# Patient Record
Sex: Female | Born: 1955
Health system: Southern US, Community
[De-identification: ages and names within clinical notes are randomized; demographics above are authoritative.]

## PROBLEM LIST (undated history)

## (undated) DIAGNOSIS — Z85850 Personal history of malignant neoplasm of thyroid: Secondary | ICD-10-CM

## (undated) DIAGNOSIS — Z9889 Other specified postprocedural states: Secondary | ICD-10-CM

## (undated) DIAGNOSIS — C801 Malignant (primary) neoplasm, unspecified: Secondary | ICD-10-CM

## (undated) DIAGNOSIS — J984 Other disorders of lung: Secondary | ICD-10-CM

## (undated) DIAGNOSIS — E039 Hypothyroidism, unspecified: Secondary | ICD-10-CM

## (undated) DIAGNOSIS — Z86718 Personal history of other venous thrombosis and embolism: Secondary | ICD-10-CM

## (undated) DIAGNOSIS — E892 Postprocedural hypoparathyroidism: Secondary | ICD-10-CM

## (undated) DIAGNOSIS — E785 Hyperlipidemia, unspecified: Secondary | ICD-10-CM

## (undated) DIAGNOSIS — I10 Essential (primary) hypertension: Secondary | ICD-10-CM

## (undated) DIAGNOSIS — N209 Urinary calculus, unspecified: Secondary | ICD-10-CM

## (undated) HISTORY — DX: Hyperlipidemia, unspecified: E78.5

## (undated) HISTORY — DX: Hypothyroidism, unspecified: E03.9

## (undated) HISTORY — DX: Other disorders of lung: J98.4

## (undated) HISTORY — DX: Malignant (primary) neoplasm, unspecified: C80.1

## (undated) HISTORY — DX: Urinary calculus, unspecified: N20.9

## (undated) HISTORY — DX: Postprocedural hypoparathyroidism: E89.2

## (undated) HISTORY — DX: Other specified postprocedural states: Z98.890

## (undated) HISTORY — DX: Personal history of malignant neoplasm of thyroid: Z85.850

## (undated) HISTORY — DX: Personal history of other venous thrombosis and embolism: Z86.718

## (undated) HISTORY — DX: Essential (primary) hypertension: I10

## (undated) HISTORY — PX: AUGMENTATION MAMMAPLASTY: SUR837

## (undated) HISTORY — PX: ABDOMINAL HYSTERECTOMY: SUR658

---

## 2000-04-25 HISTORY — PX: MASTECTOMY: SHX3

## 2001-05-08 ENCOUNTER — Ambulatory Visit (HOSPITAL_COMMUNITY): Admission: RE | Admit: 2001-05-08 | Discharge: 2001-05-08 | Payer: Self-pay | Admitting: General Surgery

## 2001-06-14 ENCOUNTER — Encounter: Payer: Self-pay | Admitting: General Surgery

## 2001-06-14 ENCOUNTER — Ambulatory Visit (HOSPITAL_COMMUNITY): Admission: RE | Admit: 2001-06-14 | Discharge: 2001-06-14 | Payer: Self-pay | Admitting: General Surgery

## 2001-07-11 ENCOUNTER — Encounter: Payer: Self-pay | Admitting: General Surgery

## 2001-07-11 ENCOUNTER — Ambulatory Visit (HOSPITAL_COMMUNITY): Admission: RE | Admit: 2001-07-11 | Discharge: 2001-07-11 | Payer: Self-pay | Admitting: General Surgery

## 2002-01-17 ENCOUNTER — Ambulatory Visit: Admission: RE | Admit: 2002-01-17 | Discharge: 2002-04-17 | Payer: Self-pay | Admitting: Radiation Oncology

## 2002-07-08 ENCOUNTER — Ambulatory Visit (HOSPITAL_COMMUNITY): Admission: RE | Admit: 2002-07-08 | Discharge: 2002-07-08 | Payer: Self-pay | Admitting: General Surgery

## 2003-09-24 ENCOUNTER — Encounter: Admission: RE | Admit: 2003-09-24 | Discharge: 2003-09-24 | Payer: Self-pay | Admitting: Neurology

## 2004-06-22 ENCOUNTER — Ambulatory Visit: Payer: Self-pay | Admitting: Cardiology

## 2004-07-26 ENCOUNTER — Ambulatory Visit: Payer: Self-pay | Admitting: Internal Medicine

## 2005-04-25 HISTORY — PX: THYROIDECTOMY: SHX17

## 2005-09-22 ENCOUNTER — Ambulatory Visit (HOSPITAL_COMMUNITY): Admission: RE | Admit: 2005-09-22 | Discharge: 2005-09-22 | Payer: Self-pay | Admitting: Hematology and Oncology

## 2005-10-19 ENCOUNTER — Ambulatory Visit (HOSPITAL_COMMUNITY): Admission: RE | Admit: 2005-10-19 | Discharge: 2005-10-19 | Payer: Self-pay | Admitting: Hematology and Oncology

## 2005-10-19 ENCOUNTER — Encounter (INDEPENDENT_AMBULATORY_CARE_PROVIDER_SITE_OTHER): Payer: Self-pay | Admitting: *Deleted

## 2005-11-23 DIAGNOSIS — Z9089 Acquired absence of other organs: Secondary | ICD-10-CM

## 2005-11-23 DIAGNOSIS — E890 Postprocedural hypothyroidism: Secondary | ICD-10-CM

## 2005-11-23 HISTORY — DX: Acquired absence of other organs: Z90.89

## 2005-11-23 HISTORY — DX: Postprocedural hypothyroidism: E89.0

## 2005-12-23 ENCOUNTER — Encounter (INDEPENDENT_AMBULATORY_CARE_PROVIDER_SITE_OTHER): Payer: Self-pay | Admitting: *Deleted

## 2005-12-23 ENCOUNTER — Ambulatory Visit (HOSPITAL_COMMUNITY): Admission: RE | Admit: 2005-12-23 | Discharge: 2005-12-24 | Payer: Self-pay | Admitting: Surgery

## 2006-01-27 ENCOUNTER — Ambulatory Visit: Payer: Self-pay | Admitting: Endocrinology

## 2006-02-06 ENCOUNTER — Encounter: Admission: RE | Admit: 2006-02-06 | Discharge: 2006-02-06 | Payer: Self-pay | Admitting: Endocrinology

## 2006-03-07 ENCOUNTER — Encounter: Admission: RE | Admit: 2006-03-07 | Discharge: 2006-03-07 | Payer: Self-pay | Admitting: Endocrinology

## 2006-03-23 ENCOUNTER — Ambulatory Visit: Payer: Self-pay | Admitting: Endocrinology

## 2006-09-05 ENCOUNTER — Ambulatory Visit: Payer: Self-pay | Admitting: Endocrinology

## 2006-09-05 LAB — CONVERTED CEMR LAB
Calcium, Total (PTH): 9.2 mg/dL (ref 8.4–10.5)
TSH: 0.65 microintl units/mL (ref 0.35–5.50)

## 2006-10-06 ENCOUNTER — Ambulatory Visit: Payer: Self-pay | Admitting: Endocrinology

## 2006-10-31 ENCOUNTER — Encounter: Admission: RE | Admit: 2006-10-31 | Discharge: 2006-10-31 | Payer: Self-pay | Admitting: Endocrinology

## 2006-11-27 ENCOUNTER — Ambulatory Visit: Payer: Self-pay | Admitting: Endocrinology

## 2006-11-27 LAB — CONVERTED CEMR LAB
TSH: 147.863 microintl units/mL — ABNORMAL HIGH (ref 0.350–5.50)
Thyroglobulin Ab: <30 (ref 0.0–60.0)

## 2007-01-04 ENCOUNTER — Encounter: Payer: Self-pay | Admitting: Endocrinology

## 2007-01-04 DIAGNOSIS — I10 Essential (primary) hypertension: Secondary | ICD-10-CM

## 2007-01-04 DIAGNOSIS — Z86718 Personal history of other venous thrombosis and embolism: Secondary | ICD-10-CM

## 2007-01-04 DIAGNOSIS — R32 Unspecified urinary incontinence: Secondary | ICD-10-CM

## 2007-01-04 DIAGNOSIS — Z853 Personal history of malignant neoplasm of breast: Secondary | ICD-10-CM

## 2007-01-05 ENCOUNTER — Ambulatory Visit: Payer: Self-pay | Admitting: Endocrinology

## 2007-01-05 DIAGNOSIS — E209 Hypoparathyroidism, unspecified: Secondary | ICD-10-CM

## 2007-01-05 DIAGNOSIS — C73 Malignant neoplasm of thyroid gland: Secondary | ICD-10-CM

## 2007-01-05 LAB — CONVERTED CEMR LAB: TSH: 0.14 microintl units/mL — ABNORMAL LOW (ref 0.35–5.50)

## 2007-01-07 ENCOUNTER — Encounter: Payer: Self-pay | Admitting: Endocrinology

## 2007-03-20 ENCOUNTER — Ambulatory Visit: Payer: Self-pay | Admitting: Endocrinology

## 2007-03-20 DIAGNOSIS — R209 Unspecified disturbances of skin sensation: Secondary | ICD-10-CM

## 2007-03-26 LAB — CONVERTED CEMR LAB
CO2: 36 meq/L — ABNORMAL HIGH (ref 19–32)
GFR calc Af Amer: 75 mL/min
Glucose, Bld: 90 mg/dL (ref 70–99)
Potassium: 3.1 meq/L — ABNORMAL LOW (ref 3.5–5.1)

## 2007-06-28 ENCOUNTER — Encounter: Payer: Self-pay | Admitting: Endocrinology

## 2007-07-05 ENCOUNTER — Encounter: Payer: Self-pay | Admitting: Endocrinology

## 2007-07-09 ENCOUNTER — Ambulatory Visit (HOSPITAL_BASED_OUTPATIENT_CLINIC_OR_DEPARTMENT_OTHER): Admission: RE | Admit: 2007-07-09 | Discharge: 2007-07-09 | Payer: Self-pay | Admitting: Specialist

## 2007-08-01 ENCOUNTER — Ambulatory Visit: Payer: Self-pay | Admitting: Endocrinology

## 2007-08-01 DIAGNOSIS — E890 Postprocedural hypothyroidism: Secondary | ICD-10-CM

## 2007-08-02 ENCOUNTER — Encounter: Payer: Self-pay | Admitting: Endocrinology

## 2007-08-02 LAB — CONVERTED CEMR LAB: TSH: 0.06 microintl units/mL — ABNORMAL LOW (ref 0.35–5.50)

## 2007-08-05 LAB — CONVERTED CEMR LAB
Calcium, Total (PTH): 9.8 mg/dL (ref 8.4–10.5)
PTH: 5.8 pg/mL — ABNORMAL LOW (ref 14.0–72.0)

## 2007-08-28 ENCOUNTER — Encounter (INDEPENDENT_AMBULATORY_CARE_PROVIDER_SITE_OTHER): Payer: Self-pay | Admitting: *Deleted

## 2007-09-04 ENCOUNTER — Encounter: Payer: Self-pay | Admitting: Endocrinology

## 2007-11-05 ENCOUNTER — Ambulatory Visit (HOSPITAL_BASED_OUTPATIENT_CLINIC_OR_DEPARTMENT_OTHER): Admission: RE | Admit: 2007-11-05 | Discharge: 2007-11-05 | Payer: Self-pay | Admitting: Specialist

## 2008-01-04 ENCOUNTER — Encounter: Payer: Self-pay | Admitting: Endocrinology

## 2008-01-10 ENCOUNTER — Encounter: Payer: Self-pay | Admitting: Endocrinology

## 2008-02-06 ENCOUNTER — Ambulatory Visit: Payer: Self-pay | Admitting: Endocrinology

## 2008-02-13 ENCOUNTER — Ambulatory Visit (HOSPITAL_COMMUNITY): Admission: RE | Admit: 2008-02-13 | Discharge: 2008-02-13 | Payer: Self-pay | Admitting: Urology

## 2008-07-17 ENCOUNTER — Encounter: Payer: Self-pay | Admitting: Endocrinology

## 2008-08-04 ENCOUNTER — Ambulatory Visit: Payer: Self-pay | Admitting: Endocrinology

## 2008-08-04 LAB — CONVERTED CEMR LAB
Calcium, Total (PTH): 9.5 mg/dL (ref 8.4–10.5)
PTH: 30.5 pg/mL (ref 14.0–72.0)

## 2008-09-05 ENCOUNTER — Ambulatory Visit (HOSPITAL_COMMUNITY): Admission: RE | Admit: 2008-09-05 | Discharge: 2008-09-05 | Payer: Self-pay | Admitting: Urology

## 2008-10-28 ENCOUNTER — Ambulatory Visit (HOSPITAL_COMMUNITY): Admission: RE | Admit: 2008-10-28 | Discharge: 2008-10-28 | Payer: Self-pay | Admitting: Urology

## 2008-10-29 ENCOUNTER — Encounter: Admission: RE | Admit: 2008-10-29 | Discharge: 2008-10-29 | Payer: Self-pay | Admitting: Internal Medicine

## 2008-11-10 ENCOUNTER — Encounter (INDEPENDENT_AMBULATORY_CARE_PROVIDER_SITE_OTHER): Payer: Self-pay | Admitting: Specialist

## 2008-11-10 ENCOUNTER — Ambulatory Visit (HOSPITAL_BASED_OUTPATIENT_CLINIC_OR_DEPARTMENT_OTHER): Admission: RE | Admit: 2008-11-10 | Discharge: 2008-11-10 | Payer: Self-pay | Admitting: Specialist

## 2009-01-20 ENCOUNTER — Encounter: Payer: Self-pay | Admitting: Endocrinology

## 2009-07-14 ENCOUNTER — Encounter: Payer: Self-pay | Admitting: Endocrinology

## 2009-07-28 ENCOUNTER — Ambulatory Visit (HOSPITAL_COMMUNITY): Admission: RE | Admit: 2009-07-28 | Discharge: 2009-07-28 | Payer: Self-pay | Admitting: Urology

## 2009-08-06 ENCOUNTER — Ambulatory Visit: Payer: Self-pay | Admitting: Endocrinology

## 2009-08-06 ENCOUNTER — Telehealth: Payer: Self-pay | Admitting: Endocrinology

## 2009-08-06 LAB — CONVERTED CEMR LAB: Calcium, Total (PTH): 9.6 mg/dL (ref 8.4–10.5)

## 2009-10-30 ENCOUNTER — Encounter: Admission: RE | Admit: 2009-10-30 | Discharge: 2009-10-30 | Payer: Self-pay | Admitting: Hematology and Oncology

## 2009-11-24 ENCOUNTER — Inpatient Hospital Stay (HOSPITAL_COMMUNITY): Admission: AD | Admit: 2009-11-24 | Discharge: 2009-11-28 | Payer: Self-pay | Admitting: Interventional Cardiology

## 2009-11-24 ENCOUNTER — Ambulatory Visit: Payer: Self-pay | Admitting: Pulmonary Disease

## 2009-11-25 ENCOUNTER — Encounter (INDEPENDENT_AMBULATORY_CARE_PROVIDER_SITE_OTHER): Payer: Self-pay | Admitting: Interventional Cardiology

## 2009-12-04 ENCOUNTER — Ambulatory Visit: Payer: Self-pay | Admitting: Endocrinology

## 2009-12-04 LAB — CONVERTED CEMR LAB
CO2: 26 meq/L (ref 19–32)
Chloride: 112 meq/L (ref 96–112)
Creatinine, Ser: 1.8 mg/dL — ABNORMAL HIGH (ref 0.4–1.2)
Potassium: 4 meq/L (ref 3.5–5.1)

## 2010-01-12 ENCOUNTER — Ambulatory Visit: Payer: Self-pay | Admitting: Endocrinology

## 2010-01-12 LAB — CONVERTED CEMR LAB
BUN: 13 mg/dL (ref 6–23)
CO2: 32 meq/L (ref 19–32)
Calcium, Total (PTH): 9.3 mg/dL (ref 8.4–10.5)
GFR calc non Af Amer: 52.18 mL/min (ref >60)
Glucose, Bld: 82 mg/dL (ref 70–99)
PTH: 4.1 pg/mL — ABNORMAL LOW (ref 14.0–72.0)
Potassium: 4.4 meq/L (ref 3.5–5.1)

## 2010-01-20 ENCOUNTER — Telehealth: Payer: Self-pay | Admitting: Endocrinology

## 2010-01-25 ENCOUNTER — Ambulatory Visit (HOSPITAL_COMMUNITY): Admission: RE | Admit: 2010-01-25 | Discharge: 2010-01-25 | Payer: Self-pay | Admitting: Internal Medicine

## 2010-02-04 ENCOUNTER — Encounter: Payer: Self-pay | Admitting: Endocrinology

## 2010-02-08 ENCOUNTER — Ambulatory Visit (HOSPITAL_BASED_OUTPATIENT_CLINIC_OR_DEPARTMENT_OTHER): Admission: RE | Admit: 2010-02-08 | Discharge: 2010-02-09 | Payer: Self-pay | Admitting: Specialist

## 2010-02-12 ENCOUNTER — Encounter (HOSPITAL_COMMUNITY)
Admission: RE | Admit: 2010-02-12 | Discharge: 2010-04-24 | Payer: Self-pay | Source: Home / Self Care | Attending: Cardiology | Admitting: Cardiology

## 2010-03-04 ENCOUNTER — Encounter: Payer: Self-pay | Admitting: Endocrinology

## 2010-05-20 ENCOUNTER — Encounter: Payer: Self-pay | Admitting: Endocrinology

## 2010-05-21 ENCOUNTER — Other Ambulatory Visit: Payer: Self-pay | Admitting: Hematology and Oncology

## 2010-05-21 DIAGNOSIS — Z1239 Encounter for other screening for malignant neoplasm of breast: Secondary | ICD-10-CM

## 2010-05-25 NOTE — Progress Notes (Signed)
  Phone Note Call from Patient   Summary of Call: pt needs refill on Levothyroxine Initial call taken by: Josph Macho RMA,  August 06, 2009 2:55 PM    Prescriptions: LEVOTHYROXINE SODIUM 150 MCG TABS (LEVOTHYROXINE SODIUM) qd  #30 x 5   Entered by:   Josph Macho RMA   Authorized by:   Minus Breeding MD   Signed by:   Josph Macho RMA on 08/06/2009   Method used:   Electronically to        [facility]* (retail)       [address]       [place], [place]  [address]       Ph: [phone]       Fax: [phone]   RxID:   [id number]

## 2010-05-25 NOTE — Letter (Signed)
Summary: [facility]  [facility]   Imported By: Sherian Rein 02/19/2010 10:18:03  _____________________________________________________________________  External Attachment:    Type:   Image     Comment:   External Document

## 2010-05-25 NOTE — Assessment & Plan Note (Signed)
Summary: FU Natale Milch  #   Vital Signs:  Patient profile:   55 year old female Height:      68 inches (172.72 cm) Weight:      215 pounds (97.73 kg) BMI:     32.81 O2 Sat:      98 % on Room air Temp:     96.6 degrees F (35.89 degrees C) oral Pulse rate:   60 / minute BP sitting:   104 / 66  (right arm) Cuff size:   regular  Vitals Entered By: Brenton Grills MA (December 04, 2009 3:33 PM)  O2 Flow:  Room air CC: Hosp F/U/aj Comments Pt was instructed to stop taking Calcium supplement, Postassium Chloride, Furosemide, Spironolactone, Levothyroxine   Primary Aureliano Oshields:  shah,ashish  CC:  Hosp F/U/aj.  History of Present Illness: pt was recently in the hospital for hypovolemia and severe hypercalcemia.  she is feeling much better.  she last had lithotripsy approx 3 years ago.    Current Medications (verified): 1)  Toprol Xl 100 Mg  Tb24 (Metoprolol Succinate) .... Take 1 By Mouth Qd 2)  Arimidex 1 Mg  Tabs (Anastrozole) .... Take 1 By Mouth Qd 3)  Adult Aspirin Low Strength 81 Mg  Tbdp (Aspirin) .... Take 1 By Mouth Qd 4)  Calcium 600/vitamin D 600-200 Mg-Unit  Tabs (Calcium Carbonate-Vitamin D) .... Take 3 By Mouth Qd 5)  Lipitor 20 Mg Tabs (Atorvastatin Calcium) .... Take 1 By Mouth Qd 6)  Doxycycline Hyclate 100 Mg Tabs (Doxycycline Hyclate) .... Take 1 By Mouth Qd 7)  Klor-Con M20 20 Meq Cr-Tabs (Potassium Chloride Crys Cr) .... Take 1 By Mouth Qd 8)  Omeprazole 20 Mg Cpdr (Omeprazole) .... Take 1 By Mouth Qd 9)  Phentermine Hcl 37.5 Mg Tabs (Phentermine Hcl) .... Take 1 By Mouth Qd 10)  Furosemide 40 Mg Tabs (Furosemide) .... Take 1 By Mouth Qd 11)  Spironolactone 50 Mg Tabs (Spironolactone) .... Take 1 By Mouth Qd 12)  Vesicare 10 Mg Tabs (Solifenacin Succinate) .... Qd 13)  Levothyroxine Sodium 137 Mcg Tabs (Levothyroxine Sodium) .Marland Kitchen.. 1 Once Daily 14)  Calcitonin (Salmon) 200 Unit/act Soln (Calcitonin ([place]mon)) .Marland Kitchen.. 1 Spray in Each Nostril Once Daily 15)  Restasis 0.05 %  Emul (Cyclosporine) .Marland Kitchen.. 1 Drop in Each Eye Two Times A Day 16)  Levothyroxine Sodium 125 Mcg Tabs (Levothyroxine Sodium) .Marland Kitchen.. 1 Tablet Once Daily  Allergies (verified): No Known Drug Allergies  Past History:  Past Medical History: Last updated: 08/06/2009 Breast cancer, hx of DVT, hx of Hypertension Lung Disease Dyslipidemia Urolithiasis Urinary incontinence Hypothyroidism stage 1 papillary adenocarcinoma thyroid      8/07    thyroidect for suspicious nodules      8/07    pathol: 2 foci left lobe (10 and 8 mm)      11/07  131-i rx [facility]       7/08    neg thyrogen scan      8/08    thyroglobulin=0.7 (antibody neg)      4/09    tg and anti-tg antibody are neg     10/09   tg and anti-tg antibody are neg      4/10    tg and anti-tg antibody are neg      4/11    tg and anti-tg antibody are neg postop hypoparathyroidism  Review of Systems       denies n/v  Physical Exam  General:  obese.  no distress  Extremities:  1+ right pedal edema and 1+ left pedal edema.   Additional Exam:  Sodium               [H]  146 mEq/L                   135-145   Potassium                 4.0 mEq/L                   3.5-5.1   Chloride                  112 mEq/L                   96-112   Carbon Dioxide            26 mEq/L                    19-32   Glucose                   90 mg/dL                    16-10   BUN                       16 mg/dL                    9-60   Creatinine           [H]  1.8 mg/dL                   4.5-4.0   Calcium                   9.2 mg/dL      Impression & Recommendations:  Problem # 1:  HYPERCALCEMIA (ICD-275.42) Assessment Improved was prob due to #2  Problem # 2:  renal failure, acute improved  Medications Added to Medication List This Visit: 1)  Restasis 0.05 % Emul (Cyclosporine) .Marland Kitchen.. 1 drop in each eye two times a day 2)  Levothyroxine Sodium 125 Mcg Tabs (Levothyroxine sodium) .Marland Kitchen.. 1 tablet once daily  Other Orders: TLB-BMP (Basic Metabolic  Panel-BMET) (80048-METABOL) Est. Patient Level III (98119)  Patient Instructions: 1)  blood tests are being ordered for you today.  please call [phone] to hear your test results. 2)  pending the test results, please stop the calcitonin nasal spray.   3)  for now, do not take any calcuim or vitamin-d products. 4)  Please schedule a follow-up appointment in 1 month. 5)  (update: i left message on phone-tree:  rx as we discussed)

## 2010-05-25 NOTE — Progress Notes (Signed)
Summary: Rx refill req  Phone Note Refill Request Message from:  Patient on January 20, 2010 2:03 PM  Refills Requested: Medication #1:  LEVOTHYROXINE SODIUM 125 MCG TABS 1 tablet once daily.   Dosage confirmed as above?Dosage Confirmed   Supply Requested: 6 months  Method Requested: Electronic Initial call taken by: Margaret Pyle, CMA,  January 20, 2010 2:03 PM    Prescriptions: LEVOTHYROXINE SODIUM 125 MCG TABS (LEVOTHYROXINE SODIUM) 1 tablet once daily  #30 x 5   Entered by:   Margaret Pyle, CMA   Authorized by:   Minus Breeding MD   Signed by:   Margaret Pyle, CMA on 01/20/2010   Method used:   Electronically to        [facility]* (retail)       [address]       [place], [place]  [address]       Ph: [phone]       Fax: [phone]   RxID:   [id number]

## 2010-05-25 NOTE — Letter (Signed)
Summary: [facility]  [facility]   Imported By: Lester [place] 03/16/2010 07:15:32  _____________________________________________________________________  External Attachment:    Type:   Image     Comment:   External Document

## 2010-05-25 NOTE — Assessment & Plan Note (Signed)
Summary: 1 YEAR FU  $50---STC  RS'D PER PT   Vital Signs:  Patient profile:   55 year old female Height:      68 inches (172.72 cm) Weight:      207.13 pounds (94.15 kg) BMI:     31.61 O2 Sat:      94 % on Room air Temp:     96.8 degrees F (36 degrees C) oral Pulse rate:   70 / minute BP sitting:   112 / 82  (left arm) Cuff size:   regular  Vitals Entered By: Josph Macho RMA (August 06, 2009 2:31 PM)  O2 Flow:  Room air CC: 1 year follow up/ pt states she is no longer taking Ditropan/ CF   Primary Provider:  shah,ashish  CC:  1 year follow up/ pt states she is no longer taking Ditropan/ CF.  History of Present Illness: the status of at least 3 ongoing medical problems is addressed today: papillary adenocarcinoma of the thyroid:  she does not notice and nodule on the neck postop hypoparathyroidism:  she denies cramps postop hypothyroidism:  she has regained some of the weight she lost.  Current Medications (verified): 1)  Toprol Xl 100 Mg  Tb24 (Metoprolol Succinate) .... Take 1 By Mouth Qd 2)  Arimidex 1 Mg  Tabs (Anastrozole) .... Take 1 By Mouth Qd 3)  Ditropan Xl 10 Mg  Tb24 (Oxybutynin Chloride) .... Take 1 By Mouth Qd 4)  Adult Aspirin Low Strength 81 Mg  Tbdp (Aspirin) .... Take 1 By Mouth Qd 5)  Calcium 600/vitamin D 600-200 Mg-Unit  Tabs (Calcium Carbonate-Vitamin D) .... Take 3 By Mouth Qd 6)  Lipitor 20 Mg Tabs (Atorvastatin Calcium) .... Take 1 By Mouth Qd 7)  Doxycycline Hyclate 100 Mg Tabs (Doxycycline Hyclate) .... Take 1 By Mouth Qd 8)  Klor-Con M20 20 Meq Cr-Tabs (Potassium Chloride Crys Cr) .... Take 1 By Mouth Qd 9)  Omeprazole 20 Mg Cpdr (Omeprazole) .... Take 1 By Mouth Qd 10)  Phentermine Hcl 37.5 Mg Tabs (Phentermine Hcl) .... Take 1 By Mouth Qd 11)  Furosemide 40 Mg Tabs (Furosemide) .... Take 1 By Mouth Qd 12)  Spironolactone 50 Mg Tabs (Spironolactone) .... Take 1 By Mouth Qd 13)  Levothyroxine Sodium 150 Mcg Tabs (Levothyroxine Sodium) ....  Qd 14)  Vesicare 10 Mg Tabs (Solifenacin Succinate) .... Qd  Allergies (verified): No Known Drug Allergies  Past History:  Past Medical History: Breast cancer, hx of DVT, hx of Hypertension Lung Disease Dyslipidemia Urolithiasis Urinary incontinence Hypothyroidism stage 1 papillary adenocarcinoma thyroid      8/07    thyroidect for suspicious nodules      8/07    pathol: 2 foci left lobe (10 and 8 mm)      11/07  131-i rx [facility]       7/08    neg thyrogen scan      8/08    thyroglobulin=0.7 (antibody neg)      4/09    tg and anti-tg antibody are neg     10/09   tg and anti-tg antibody are neg      4/10    tg and anti-tg antibody are neg      4/11    tg and anti-tg antibody are neg postop hypoparathyroidism  Review of Systems       she has fatigue.  denies dysphagia.  Physical Exam  General:  normal appearance.   Neck:  a healed scar is present.  i do not appreciate a nodule in the thyroid or elsewhere in the neck  Cervical Nodes:  No significant adenopathy.  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  Problem # 3:  HYPOPARATHYROIDISM (ICD-252.1) ca++ is well-controlled on ca++ supplement  Medications Added to Medication List This Visit: 1)  Vesicare 10 Mg Tabs (Solifenacin succinate) .... Qd 2)  Levothyroxine Sodium 137 Mcg Tabs (Levothyroxine sodium) .Marland Kitchen.. 1 once daily  Other Orders: T-Parathyroid Hormone, Intact w/ Calcium  (57846-96295) T-Thyroglobulin Panel (28413, [phone]) TLB-TSH (Thyroid Stimulating Hormone) (84443-TSH) Est. Patient Level IV (36644)  Patient Instructions: 1)  tests are being ordered for you today.  a few days after the test(s), please call [phone] to hear your test results. 2)  pending the test results, please continue the same medications for now 3)  return 1 year if blood tests are normal 4)  (update: i left message on phone-tree:  reduce synthroid to 137/d) Prescriptions: LEVOTHYROXINE SODIUM 137 MCG TABS (LEVOTHYROXINE SODIUM) 1 once daily  #30 x 11   Entered and Authorized by:   Minus Breeding MD   Signed by:   Minus Breeding MD on 08/06/2009   Method used:   Electronically to        [facility]* (retail)       [address]       [place], [place]  [address]       Ph: [phone]       Fax: [phone]   RxID:   [id number]

## 2010-05-25 NOTE — Assessment & Plan Note (Signed)
Summary: 1 mth fu---stc   Vital Signs:  Patient profile:   55 year old female Height:      68 inches (172.72 cm) Weight:      216 pounds (98.18 kg) BMI:     32.96 O2 Sat:      98 % on Room air Temp:     97.5 degrees F (36.39 degrees C) oral Pulse rate:   72 / minute BP sitting:   110 / 78  (right arm) Cuff size:   regular  Vitals Entered By: Brenton Grills MA (January 12, 2010 1:11 PM)  O2 Flow:  Room air CC: 1 month F/U/aj   Primary Provider:  shah,ashish  CC:  1 month F/U/aj.  History of Present Illness: the status of at least 3 ongoing medical problems is addressed today: hypercalcemia:  she denies cramps. postop hypothyroidism:  denies weight gain thyroid cancer:  she does not notice any nodule at the head or neck.    Current Medications (verified): 1)  Toprol Xl 100 Mg  Tb24 (Metoprolol Succinate) .... Take 1 By Mouth Qd 2)  Arimidex 1 Mg  Tabs (Anastrozole) .... Take 1 By Mouth Qd 3)  Adult Aspirin Low Strength 81 Mg  Tbdp (Aspirin) .... Take 1 By Mouth Qd 4)  Lipitor 20 Mg Tabs (Atorvastatin Calcium) .... Take 1 By Mouth Qd 5)  Doxycycline Hyclate 100 Mg Tabs (Doxycycline Hyclate) .... Take 1 By Mouth Qd 6)  Omeprazole 20 Mg Cpdr (Omeprazole) .... Take 1 By Mouth Qd 7)  Phentermine Hcl 37.5 Mg Tabs (Phentermine Hcl) .... Take 1 By Mouth Qd 8)  Vesicare 10 Mg Tabs (Solifenacin Succinate) .... Qd 9)  Restasis 0.05 % Emul (Cyclosporine) .Marland Kitchen.. 1 Drop in Each Eye Two Times A Day 10)  Levothyroxine Sodium 125 Mcg Tabs (Levothyroxine Sodium) .Marland Kitchen.. 1 Tablet Once Daily  Allergies (verified): No Known Drug Allergies  Past History:  Past Medical History: Last updated: 08/06/2009 Breast cancer, hx of DVT, hx of Hypertension Lung Disease Dyslipidemia Urolithiasis Urinary incontinence Hypothyroidism stage 1 papillary adenocarcinoma thyroid      8/07    thyroidect for suspicious nodules      8/07    pathol: 2 foci left lobe (10 and 8 mm)      11/07  131-i rx 109  [facility]       7/08    neg thyrogen scan      8/08    thyroglobulin=0.7 (antibody neg)      4/09    tg and anti-tg antibody are neg     10/09   tg and anti-tg antibody are neg      4/10    tg and anti-tg antibody are neg      4/11    tg and anti-tg antibody are neg postop hypoparathyroidism  Review of Systems  The patient denies depression and weight loss.    Physical Exam  General:  obese.  no distress  Neck:  a healed scar is present.  i do not appreciate a nodule in the thyroid or elsewhere in the neck  Additional Exam:  Thyroglobulin Antibody        <20.0 U/mL                  <40.0 ! Thyroglobulin             <0.2 ng/mL                  0.0-55.0  Parathyroid Hormone  [L]  4.1 pg/mL                   14.0-72.0 Calcium                   9.3 mg/dL    Sodium                    144 mEq/L                   135-145   Potassium                 4.4 mEq/L                   3.5-5.1   Chloride                  103 mEq/L                   96-112   Carbon Dioxide            32 mEq/L                    19-32   Glucose                   82 mg/dL                    98-11   BUN                       13 mg/dL                    9-14   Creatinine                1.2 mg/dL                   7.8-2.9   Calcium                   9.4 mg/dL                   5.6-21.3      FastTSH              [L]  0.09 uIU/mL     Impression & Recommendations:  Problem # 1:  CARCINOMA, THYROID GLAND, 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 Patient Level IV (72536)  Patient Instructions: 1)   blood tests are being ordered for you today.  please call [phone] to hear your test results. 2)  pending the test results, please continue the same medications for now 3)  Please schedule a follow-up appointment in 6 months. 4)  (update: i left message on phone-tree:  rx as we discussed)

## 2010-05-25 NOTE — Letter (Signed)
Summary: [facility]  [facility]   Imported By: Sherian Rein 07/27/2009 08:10:59  _____________________________________________________________________  External Attachment:    Type:   Image     Comment:   External Document

## 2010-05-25 NOTE — Progress Notes (Signed)
Summary: thyroid question  Phone Note Call from Patient Call back at Home Phone [phone]   Caller: Patient Summary of Call: Patient called [facility] stating that Dr Everardo All wanted to start her on levothyroxine . Her other MD Dr. Clelia Croft suggested starting her on . Patient would like to clarify which she should take. She feel that is too low of a dosage due to hx of thyroid cancer. Initial call taken by: Rock Nephew CMA,  January 20, 2010 11:59 AM  Follow-up for Phone Call        in view of her h/o cancer, she should take the higher dosage. Follow-up by: Minus Breeding MD,  January 20, 2010 12:18 PM  Additional Follow-up for Phone Call Additional follow up Details #1::        pt advised via VM, told to call back with any further questions or concerns Additional Follow-up by: Margaret Pyle, CMA,  January 20, 2010 1:12 PM

## 2010-06-07 ENCOUNTER — Other Ambulatory Visit: Payer: Self-pay | Admitting: Podiatry

## 2010-06-10 NOTE — Letter (Signed)
Summary: [facility]  [facility]   Imported By: Sherian Rein 05/31/2010 14:26:06  _____________________________________________________________________  External Attachment:    Type:   Image     Comment:   External Document

## 2010-07-08 LAB — BASIC METABOLIC PANEL
BUN: 18 mg/dL (ref 6–23)
CO2: 29 mEq/L (ref 19–32)
Chloride: 103 mEq/L (ref 96–112)
Creatinine, Ser: 1.17 mg/dL (ref 0.4–1.2)
Glucose, Bld: 92 mg/dL (ref 70–99)

## 2010-07-08 LAB — CBC
MCH: 31 pg (ref 26.0–34.0)
MCV: 96.6 fL (ref 78.0–100.0)
Platelets: 283 10*3/uL (ref 150–400)
RDW: 12.4 % (ref 11.5–15.5)

## 2010-07-08 LAB — DIFFERENTIAL
Basophils Absolute: 0 10*3/uL (ref 0.0–0.1)
Eosinophils Absolute: 0.2 10*3/uL (ref 0.0–0.7)
Eosinophils Relative: 4 % (ref 0–5)
Lymphs Abs: 1.3 10*3/uL (ref 0.7–4.0)

## 2010-07-09 LAB — URINALYSIS, MICROSCOPIC ONLY
Ketones, ur: NEGATIVE mg/dL
Nitrite: NEGATIVE
Specific Gravity, Urine: 1.009 (ref 1.005–1.030)
Urobilinogen, UA: 0.2 mg/dL (ref 0.0–1.0)
pH: 6 (ref 5.0–8.0)

## 2010-07-09 LAB — CBC
HCT: 28.3 % — ABNORMAL LOW (ref 36.0–46.0)
HCT: 29.7 % — ABNORMAL LOW (ref 36.0–46.0)
Hemoglobin: 11 g/dL — ABNORMAL LOW (ref 12.0–15.0)
Hemoglobin: 8.7 g/dL — ABNORMAL LOW (ref 12.0–15.0)
Hemoglobin: 9.3 g/dL — ABNORMAL LOW (ref 12.0–15.0)
MCH: 31.1 pg (ref 26.0–34.0)
MCH: 31.2 pg (ref 26.0–34.0)
MCHC: 32.9 g/dL (ref 30.0–36.0)
MCHC: 33.4 g/dL (ref 30.0–36.0)
MCHC: 34.3 g/dL (ref 30.0–36.0)
MCV: 92.5 fL (ref 78.0–100.0)
MCV: 94.6 fL (ref 78.0–100.0)
MCV: 95.7 fL (ref 78.0–100.0)
Platelets: 228 10*3/uL (ref 150–400)
RBC: 2.79 MIL/uL — ABNORMAL LOW (ref 3.87–5.11)
RDW: 12.5 % (ref 11.5–15.5)
RDW: 12.6 % (ref 11.5–15.5)
WBC: 3.9 10*3/uL — ABNORMAL LOW (ref 4.0–10.5)
WBC: 4.9 10*3/uL (ref 4.0–10.5)

## 2010-07-09 LAB — COMPREHENSIVE METABOLIC PANEL
ALT: 17 U/L (ref 0–35)
AST: 21 U/L (ref 0–37)
Albumin: 3.1 g/dL — ABNORMAL LOW (ref 3.5–5.2)
CO2: 28 mEq/L (ref 19–32)
Calcium: >15 mg/dL (ref 8.4–10.5)
GFR calc Af Amer: 11 mL/min — ABNORMAL LOW (ref >60)
Sodium: 131 mEq/L — ABNORMAL LOW (ref 135–145)
Total Protein: 6 g/dL (ref 6.0–8.3)

## 2010-07-09 LAB — TSH: TSH: 0.059 u[IU]/mL — ABNORMAL LOW (ref 0.350–4.500)

## 2010-07-09 LAB — RETICULOCYTES
RBC.: 3.69 MIL/uL — ABNORMAL LOW (ref 3.87–5.11)
Retic Ct Pct: 1 % (ref 0.4–3.1)

## 2010-07-09 LAB — RENAL FUNCTION PANEL
Albumin: 2.7 g/dL — ABNORMAL LOW (ref 3.5–5.2)
BUN: 27 mg/dL — ABNORMAL HIGH (ref 6–23)
BUN: 29 mg/dL — ABNORMAL HIGH (ref 6–23)
BUN: 32 mg/dL — ABNORMAL HIGH (ref 6–23)
CO2: 21 mEq/L (ref 19–32)
CO2: 24 mEq/L (ref 19–32)
Calcium: 11.5 mg/dL — ABNORMAL HIGH (ref 8.4–10.5)
Chloride: 103 mEq/L (ref 96–112)
Chloride: 108 mEq/L (ref 96–112)
Chloride: 114 mEq/L — ABNORMAL HIGH (ref 96–112)
Creatinine, Ser: 3.4 mg/dL — ABNORMAL HIGH (ref 0.4–1.2)
Creatinine, Ser: 3.9 mg/dL — ABNORMAL HIGH (ref 0.4–1.2)
GFR calc Af Amer: 17 mL/min — ABNORMAL LOW (ref >60)
Glucose, Bld: 116 mg/dL — ABNORMAL HIGH (ref 70–99)
Glucose, Bld: 94 mg/dL (ref 70–99)
Glucose, Bld: 95 mg/dL (ref 70–99)
Potassium: 4.5 mEq/L (ref 3.5–5.1)
Sodium: 143 mEq/L (ref 135–145)

## 2010-07-09 LAB — BASIC METABOLIC PANEL
BUN: 35 mg/dL — ABNORMAL HIGH (ref 6–23)
Chloride: 97 mEq/L (ref 96–112)
Creatinine, Ser: 5.17 mg/dL — ABNORMAL HIGH (ref 0.4–1.2)

## 2010-07-09 LAB — CARDIAC PANEL(CRET KIN+CKTOT+MB+TROPI)
CK, MB: 1.6 ng/mL (ref 0.3–4.0)
Relative Index: INVALID (ref 0.0–2.5)
Total CK: 48 U/L (ref 7–177)
Total CK: 60 U/L (ref 7–177)
Troponin I: 0.05 ng/mL (ref 0.00–0.06)

## 2010-07-09 LAB — PTH, INTACT AND CALCIUM
Calcium, Total (PTH): >15 mg/dL (ref 8.4–10.5)
PTH: <2.5 pg/mL — ABNORMAL LOW (ref 14.0–72.0)

## 2010-07-09 LAB — VITAMIN D 1,25 DIHYDROXY
Vitamin D 1, 25 (OH)2 Total: 10 pg/mL — ABNORMAL LOW (ref 18–72)
Vitamin D2 1, 25 (OH)2: <8 pg/mL
Vitamin D3 1, 25 (OH)2: 10 pg/mL

## 2010-07-09 LAB — URINE CULTURE

## 2010-07-09 LAB — DIFFERENTIAL
Basophils Absolute: 0 10*3/uL (ref 0.0–0.1)
Eosinophils Relative: 4 % (ref 0–5)
Lymphocytes Relative: 9 % — ABNORMAL LOW (ref 12–46)
Monocytes Absolute: 0.5 10*3/uL (ref 0.1–1.0)
Monocytes Relative: 11 % (ref 3–12)

## 2010-07-09 LAB — IRON AND TIBC
Iron: 94 ug/dL (ref 42–135)
TIBC: 266 ug/dL (ref 250–470)

## 2010-07-09 LAB — CALCIUM, IONIZED
Calcium, Ion: 2.39 mmol/L (ref 1.12–1.32)
Calcium, Ion: 2.54 mmol/L (ref 1.12–1.32)

## 2010-07-09 LAB — VITAMIN B12: Vitamin B-12: 1803 pg/mL — ABNORMAL HIGH (ref 211–911)

## 2010-07-09 LAB — FERRITIN: Ferritin: 337 ng/mL — ABNORMAL HIGH (ref 10–291)

## 2010-07-09 LAB — CREATININE, URINE, RANDOM: Creatinine, Urine: 60 mg/dL

## 2010-07-13 ENCOUNTER — Other Ambulatory Visit (INDEPENDENT_AMBULATORY_CARE_PROVIDER_SITE_OTHER): Payer: Medicaid Other

## 2010-07-13 ENCOUNTER — Encounter: Payer: Self-pay | Admitting: Endocrinology

## 2010-07-13 ENCOUNTER — Ambulatory Visit (INDEPENDENT_AMBULATORY_CARE_PROVIDER_SITE_OTHER): Payer: Medicaid Other | Admitting: Endocrinology

## 2010-07-13 DIAGNOSIS — E890 Postprocedural hypothyroidism: Secondary | ICD-10-CM

## 2010-07-13 DIAGNOSIS — E209 Hypoparathyroidism, unspecified: Secondary | ICD-10-CM

## 2010-07-13 DIAGNOSIS — C73 Malignant neoplasm of thyroid gland: Secondary | ICD-10-CM

## 2010-07-13 LAB — TSH: TSH: 0.17 u[IU]/mL — ABNORMAL LOW (ref 0.35–5.50)

## 2010-07-13 NOTE — Patient Instructions (Addendum)
blood tests are being ordered for you today.  please call [phone] to hear your test results. pending the test results, please continue the same medications for now Return here in 6 months (update: i left message on phone-tree:  Same rx)

## 2010-07-13 NOTE — Progress Notes (Signed)
  Subjective:    Patient ID: Amanda Meyers, female    DOB: 1956/04/16, 55 y.o.   MRN: [id number]  HPI the status of at least 3 ongoing medical problems is addressed today: hypoparathyroidism:  she denies cramps. postop hypothyroidism:  denies weight gain thyroid cancer:  she does not notice any nodule at the head or neck.    Past Medical History  Diagnosis Date  . Cancer     Breast  . History of DVT (deep vein thrombosis)   . Hypertension   . Lung disease   . Dyslipidemia   . Urolithiasis   . Urinary incontinence   . Hypothyroidism   . Surgical hypoparathyroidism   . History of papillary adenocarcinoma of thyroid     Stage 1  . S/P thyroidectomy 11/2005    Suspicious nodules, path- 2 foci left lobe (10 &26mm) I-131 Rx 02/2006, neg thyrogen scan 10/2006, thyroglobulin  0.7 neg antibody 11/2006 07/2007 01/2008 07/2008 07/2009   No past surgical history on file.  reports that she has never smoked. She does not have any smokeless tobacco history on file. She reports that she does not drink alcohol. Her drug history not on file. family history is not on file. No Known Allergies   Review of Systems Denies tremor and diaphoresis    Objective:   Physical Exam Gen:  No distress.  Obese. Neck: a healed scar is present.  i do not appreciate a nodule in the thyroid or elsewhere in the neck Skin:  Does not appear anxious nor depressed.    Labs:  Parathyroid Hormone  [L]  <2.5 pg/mL                  14.0-72.0 Calcium                   9.7 mg/dL   FastTSH              [L]  0.17 uIU/mL    Assessment & Plan:  Hypoparathyroidism, postsurgical,  well-controlled Postsurgical hypothyroidism.  This tsh is appropriate in view of her thyroid cancer Papillary adenocarcinoma of the thyroid, stage-1.  No clinical evidence of recurrence.  tg is pending.

## 2010-07-14 LAB — PTH, INTACT AND CALCIUM
Calcium, Total (PTH): 9.7 mg/dL (ref 8.4–10.5)
PTH: <2.5 pg/mL — ABNORMAL LOW (ref 14.0–72.0)

## 2010-07-23 ENCOUNTER — Telehealth: Payer: Self-pay

## 2010-07-23 NOTE — Telephone Encounter (Signed)
Please call the lab.  Thyroglobulin was ordered, but has not yet been resulted.  When will results be available?

## 2010-07-23 NOTE — Telephone Encounter (Signed)
Pt called for results of labs done 03/20. Per pt, results are not on the PT system, I verified. Please advise.

## 2010-07-27 ENCOUNTER — Other Ambulatory Visit: Payer: Medicaid Other

## 2010-07-27 NOTE — Telephone Encounter (Signed)
Per [facility], specimen was sent to [facility] for testing. I spoke with [facility] who said they received requisition but a lab error caused it not to be resulted.

## 2010-07-27 NOTE — Telephone Encounter (Signed)
Does the lab need it to be redrawn?  If so, please call patient, and advise her of this.

## 2010-07-27 NOTE — Telephone Encounter (Signed)
The anti-thyroglobulin antibody was resulted, but not the thyroglobulin level (still showing "collected")

## 2010-07-27 NOTE — Telephone Encounter (Signed)
Pt advised that she is available to have labs re-drawn 04/13. Dx code?

## 2010-07-27 NOTE — Telephone Encounter (Signed)
Results were uploaded into EMR 07/13/2010

## 2010-07-27 NOTE — Telephone Encounter (Signed)
193.00 

## 2010-07-27 NOTE — Telephone Encounter (Signed)
Pt's labs were scheduled for 04/13

## 2010-07-27 NOTE — Telephone Encounter (Signed)
Unable to schedule labs because pt has Medicaid [place] Access, please advise.

## 2010-07-28 ENCOUNTER — Other Ambulatory Visit (HOSPITAL_COMMUNITY): Payer: Self-pay | Admitting: Urology

## 2010-07-28 ENCOUNTER — Ambulatory Visit (HOSPITAL_COMMUNITY)
Admission: RE | Admit: 2010-07-28 | Discharge: 2010-07-28 | Disposition: A | Discharge status: Home / Self Care | Payer: Medicaid Other | Source: Ambulatory Visit | Attending: Urology | Admitting: Urology

## 2010-07-28 DIAGNOSIS — Z09 Encounter for follow-up examination after completed treatment for conditions other than malignant neoplasm: Secondary | ICD-10-CM | POA: Insufficient documentation

## 2010-07-28 DIAGNOSIS — N200 Calculus of kidney: Secondary | ICD-10-CM

## 2010-08-01 LAB — BASIC METABOLIC PANEL
BUN: 16 mg/dL (ref 6–23)
Chloride: 104 mEq/L (ref 96–112)
Creatinine, Ser: 0.96 mg/dL (ref 0.4–1.2)
GFR calc Af Amer: >60 mL/min (ref >60)
GFR calc non Af Amer: >60 mL/min (ref >60)
Potassium: 4.8 mEq/L (ref 3.5–5.1)

## 2010-08-01 LAB — POCT HEMOGLOBIN-HEMACUE: Hemoglobin: 13.1 g/dL (ref 12.0–15.0)

## 2010-08-06 ENCOUNTER — Other Ambulatory Visit: Payer: Self-pay | Admitting: Endocrinology

## 2010-08-06 ENCOUNTER — Other Ambulatory Visit: Payer: Medicaid Other

## 2010-08-06 DIAGNOSIS — C73 Malignant neoplasm of thyroid gland: Secondary | ICD-10-CM

## 2010-08-06 DIAGNOSIS — E209 Hypoparathyroidism, unspecified: Secondary | ICD-10-CM

## 2010-08-06 DIAGNOSIS — E890 Postprocedural hypothyroidism: Secondary | ICD-10-CM

## 2010-08-09 LAB — THYROGLOBULIN LEVEL: Thyroglobulin Ab: <20 U/mL (ref <40.0)

## 2010-08-17 ENCOUNTER — Other Ambulatory Visit: Payer: Self-pay | Admitting: Endocrinology

## 2010-08-17 MED ORDER — LEVOTHYROXINE SODIUM 125 MCG PO TABS: 125.0000 ug | ORAL_TABLET | Freq: Every day | ORAL | Status: DC

## 2010-08-17 NOTE — Telephone Encounter (Signed)
R'cd fax from [facility] for refill of pt's Levothyroxin  Last OV-07/13/2010  Last Filled-07/19/2010

## 2010-09-07 NOTE — Op Note (Signed)
NAME:  SAIDEE, GEREMIA           ACCOUNT NO.:  [id number]   MEDICAL RECORD NO.:  [id number]          PATIENT TYPE:  AMB   LOCATION:  DSC                          FACILITY:  [facility]   PHYSICIAN:  Earvin Hansen L. [place], M.D.DATE OF BIRTH:  05/29/1955   DATE OF PROCEDURE:  07/09/2007  DATE OF DISCHARGE:                               OPERATIVE REPORT   HISTORY:  This is a 51-year lady status post left mastectomy for breast  cancer and has been prepared for reconstruction using tissue expander.  Procedure, in detail, as well as the attendant risks and possible  complication risks were explained to her preoperatively as well as  alternatives of care.   PROCEDURES PERFORMED:  Tissue expansion left chest subpectorally with a  Mentor smooth round spectrum, saline reference number is [phone], lot  number is [id number], SN [id number], style is 1400, and we put in 250 mL  today.   PROCEDURE:  The patient was sat up and drawn for the midline of the  chest, as well as the right inframammary fold, as well as the  corresponding left inframammary fold.  Anterior axillary lines were also  drawn.  She then underwent general anesthesia and intubated orally.  Prep was done to both sides of the chest and breasts.  Neck was  Hibiclens soap and solution and walled off with sterile towels and  drapes as well to make a sterile field.  Previous mastectomy incision  was injected with 1/2% Xylocaine with epinephrine which was also  injected in the new inframammary fold area.  A total of 50 mL 1:200,000  concentration.  This was allowed to sit.   An incision was made approximately 8 cm at the lateral portion of the  mastectomy scar.  This was dissected through subcutaneous tissue down to  underlying pectoralis major muscle.  The muscle was divided in the  direction of its fibers, and a subpectoral plane developed.  The  pectoralis major was felt superiorly, medially, inferiorly over the  fifth rib down to the  new inframammary fold area and laterally.  After  proper hemostasis using the Bovie electrocoagulation, a tissue expander  was placed in the space subpectoral and 250 mL of saline then injected  to give [place] a start.   Next, a port was placed laterally in the axillary area, secured with 3-0  Monocryl to keep it from slipping.  Next, the muscle was closed with 2-0  Monocryl, subcutaneous tissue with 3-0 Monocryl x2 layers, then a  running subcuticular stitch of 3-0 Monocryl.  Steri-Strips and a soft  dressing were applied on the area.  The estimated blood loss less than  30 mL.   COMPLICATIONS:  None.      Yaakov Guthrie. Shon Hough, M.D.  Electronically Signed     GLT/MEDQ  D:  07/09/2007  T:  07/09/2007  Job:  [id number]

## 2010-09-07 NOTE — Op Note (Signed)
NAME:  Amanda Meyers, WINDHORST           ACCOUNT NO.:  [id number]   MEDICAL RECORD NO.:  [id number]          PATIENT TYPE:  AMB   LOCATION:  DSC                          FACILITY:  [facility]   PHYSICIAN:  Earvin Hansen L. Shon Hough, M.D.DATE OF BIRTH:  [birth year]   DATE OF PROCEDURE:  11/05/2007  DATE OF DISCHARGE:                               OPERATIVE REPORT   HISTORY:  This is a 55 year old lady with a history of left breast  cancer.  She has had a left tissue expander done by myself in the past.  She had lost over 150 pounds in the past from obesity, increased laxity,  and very macromastic on the right side.  Making it very difficult to  match her for any type of reconstruction.  She also had drooped out a  little bit on the left side with the tissue expander needs to be lifted  and also rotate immediately to go a good symmetry.   PROCEDURES PLANNED:  Right implant reconstruction, lifting of left  tissue expander and relocation, and revision.   ANESTHESIA:  General.   DESCRIPTION OF PROCEDURE:  Preoperatively, the patient was set up and  drawn for the midline of the chest as well as anterior axillary lines,  inframammary fold and the new inframammary fold area for the left tissue  expander, which is approximately 4 cm above where it should be  presently.  She underwent general anesthesia and intubated orally.  Prep  was done to the chest, breast areas in a routine fashion using Hibiclens  soap and solution, walled off with sterile towels and drapes so as to  make a sterile field.  A 0.5% of Xylocaine with epinephrine was injected  locally, 50 mL breast side.  The right side was done for which right  periareolar incision was opened out with breast tissues with underlying  pectoralis major muscle.  The muscle is divided in direction of fibers  and a subpectoral plane developed medially, superiorly, laterally, and  inferiorly.  Hemostasis was maintained with Bovie anticoagulation.  Next, a  Mentor Smooth Rounded Saline implant was placed.  The lot number  is [id number].  Her reference number is [phone] and with good symmetry,  the muscle then repaired by 2-0 Monocryl, subcutaneous tissue and breast  tissue with 2-0 Monocryl x2 layers in a running subcuticular stitch with  3-0 Monocryl to the skin.  Next, attention was turned to the left side,  the previous high transverse mastectomy.  Incision was opened and  carried down the skin and subcutaneous tissue underlying, the muscle was  divided in direction of fibers and then opened.  The tissue expander  then removed.  I was able to then plicate the inframammary area dead  space with a running 2-0 Vicryl suture as well as laterally replaced the  implant back in to its regular position.  The tissue expander was placed  back and the area of the muscle was repaired back with 2-0 Monocryl,  subcutaneous tissue and breast remnants with 2-0 Monocryl with a running  subcuticular stitch with 3-0 Monocryl.  Steri-Strips and sterile  dressing applied in all areas.  She withstood the procedures very well.   ESTIMATED BLOOD LOSS:  Less than 100 mL.   COMPLICATIONS:  None.      Yaakov Guthrie. Shon Hough, M.D.  Electronically Signed     GLT/MEDQ  D:  11/05/2007  T:  11/06/2007  Job:  [id number]

## 2010-09-07 NOTE — Op Note (Signed)
NAME:  CHARLI, HALLE           ACCOUNT NO.:  [id number]   MEDICAL RECORD NO.:  [id number]          PATIENT TYPE:  OUT   LOCATION:  RDC                           FACILITY:  [facility]   PHYSICIAN:  Earvin Hansen L. Shon Hough, M.D.DATE OF BIRTH:  January 19, 1956   DATE OF PROCEDURE:  11/10/2008  DATE OF DISCHARGE:  10/28/2008                               OPERATIVE REPORT   A 55 year old lady status post left breast cancer with mastectomy and  tissue expander reconstruction.  She has done well from that.  Now has  some scar tissue medially of fibrous contracture and is now ready to  have her port removed.   PROCEDURES DONE:  Release of scar tissue, capsule formation medially in  left breast, and removal of port.   ANESTHESIA:  General.   PROCEDURE IN DETAIL:  The patient underwent general anesthesia and  intubated orally.  Prep was done to the chest and breast areas routinely  bilaterally using Hibiclens soap and solution, walled off with sterile  towels and drapes so as to make a sterile field.  Previous lateral  incision was made.  Transverse incision was carried out laterally,  opened the port which was drawn and dissection was carried down to the  port in a submuscular dissection.  I was able to go over to the medial  portion of the breast and do a limited capsulotomy.  Free of the air  went around without preventing it from looking indented.  After this was  done, the port was dissected.  Capsule formation around it was released  and then the port was removed with its tubing.  Next, a Mohs surgery  repaired with 2-0 Monocryl.  Subcutaneous tissue with 2-0 Monocryl x2  layers and then a running subcuticular stitch of 3-0 Monocryl.  Steri-  Strips and sterile dressing were applied to all of the areas.  She  withstood the procedures very well and was taken to recovery in  excellent condition.   ESTIMATED BLOOD LOSS:  Less than 50 mL.   COMPLICATIONS:  None.      Yaakov Guthrie. Shon Hough,  M.D.  Electronically Signed     GLT/MEDQ  D:  11/10/2008  T:  11/11/2008  Job:  [id number]

## 2010-09-07 NOTE — Consult Note (Signed)
[facility]                          ENDOCRINOLOGY CONSULTATION   KENDALL, ARNELL                  MRN:          [id number]  DATE:09/05/2006                            DOB:          1955/07/19    REASON FOR VISIT:  Followup thyroid.   HISTORY OF PRESENT ILLNESS:  A 55 year old woman who last year had  thyroidectomy for 2 small foci of papillary adenocarcinoma, both on the  left lobe.   Regarding her postoperative hypothyroidism, she now takes Synthroid 150  mcg a day for the past 2 months.   She also had mild postoperative hypoparathyroidism.   PAST MEDICAL HISTORY:  1. DVT in 2007.  2. Breast cancer.  3. Hypertension.  4. 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  5. OAB.  6. Dyslipidemia.  7. Urolithiasis.   REVIEW OF SYSTEMS:  She has lost 50 pounds in the past 6 months. She  also states her skin is rough and dry.   PHYSICAL EXAMINATION:  VITAL SIGNS:  Blood pressure 128/86, heart rate  63, temperature 97.5.  GENERAL:  Obese. She is in a wheelchair.  NECK:  She has a healed surgical scar. I do not appreciate any thyroid  tissue nor lymphadenopathy.   LABORATORY DATA:  On Sep 05, 2006, TSH 0.65.   IMPRESSION:  1. Papillary adenocarcinoma of the thyroid.  2. Postoperative hypothyroidism.  3. Postoperative hypoparathyroidism.   PLAN:  1. Change Synthroid to Cytomel 12.5 mcg twice a day.  2. Return in a month to set up your next scan and laboratory studies.  3. When you go back on your Synthroid, your dosage will be 175 mcg a      day, to get better      suppression of your TSH.  4. Recheck PTH today also.     Sean A. Everardo All, MD  Electronically Signed    SAE/MedQ  DD: 09/05/2006  DT: 09/06/2006  Job #: [id number]   cc:   Kirstie Peri, MD

## 2010-09-07 NOTE — Consult Note (Signed)
[facility]                          ENDOCRINOLOGY CONSULTATION   Amanda Meyers, Amanda Meyers                  MRN:          [id number]  DATE:10/06/2006                            DOB:          05-03-1955    REASON FOR VISIT:  Follow up thyroid cancer.   HISTORY OF PRESENT ILLNESS:  A 54 year old woman who has a history of  stage I papillary adenocarcinoma of the thyroid.  She now takes Cytomel  for her hypothyroidism in preparation for an upcoming scan.   Regarding her hypoparathyroidism, her last calcium was 9.2 on Sep 05, 2006.   PAST MEDICAL HISTORY:  Same as Sep 05, 2006.   REVIEW OF SYSTEMS:  She denies weight gain or weight loss.  She denies  muscle cramps.   PHYSICAL EXAMINATION:  VITAL SIGNS:  Blood pressure 107/73, heart rate  74, temperature 97.5.  We were unable to obtain a weight because she is  in a wheelchair and is too unsteady on her feet.  GENERAL:  No distress.  NECK:  She has a healed surgical scar, and there is no palpable thyroid  tissue nor lymphadenopathy of the neck.   IMPRESSION:  1. 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  2. Postoperative hypoparathyroidism for which she is now eucalcemic on      an oral calcium supplement.   PLAN:  1. Discontinue Cytomel on June 20.  2. On October 26, 2006, come in for TSH, thyroglobulin, and      antithyroglobulin antibody.  3. Scanning dose will be given on July 8.  4. She will be scanned on July 11.  5. I will follow up the scan.  Unless she needs repeat Iodine-131      therapy, I will prescribe her Synthroid 175 mcg a day.  6. Continue the same calcium supplement.     Sean A. Everardo All, MD  Electronically Signed    SAE/MedQ  DD: 10/08/2006  DT: 10/08/2006  Job #: [id number]   cc:   Kirstie Peri, MD

## 2010-09-07 NOTE — Consult Note (Signed)
[facility]                          ENDOCRINOLOGY CONSULTATION   Amanda Meyers, Amanda Meyers                  MRN:          [id number]  DATE:11/27/2006                            DOB:          1956/04/03    REASON FOR VISIT:  Follow up thyroid cancer.   HISTORY OF PRESENT ILLNESS:  A 54 year old woman with a history of stage  I papillary adenocarcinoma of the thyroid.  She had a thyroidectomy in  2007 and had 109 mCi of iodine-131 therapy after that.  A thyrogen scan  on November 03, 2006, was negative.   Regarding her hypothyroidism, she is still off her medication and states  that she feels very tired.   PAST MEDICAL HISTORY:  1. DVT.  2. Urolithiasis.  3. Dyslipidemia.  4. Overactive bladder.  5. 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  6. Breast cancer.  7. Hypertension.   REVIEW OF SYSTEMS:  She thinks she may have gained weight, but she  cannot tell because she is in a wheelchair.   PHYSICAL EXAMINATION:  Blood pressure 141/94, heart rate is 70,  temperature 98 degrees.  GENERAL:  She is obese.  She is in a wheelchair.  NECK:  I do not appreciate any thyroid tissue.  HEENT:  Her speech is hoarse.  SKIN:  Slightly cool and dry.   LABORATORY STUDIES:  On November 27, 2006, TSH 148.  Thyroglobulin is 0.7.  Anti-thyroglobulin antibody is undetectable.   IMPRESSION:  1. 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  2. Postoperative hypothyroidism.   PLAN:  1. Resume Synthroid 175 mcg daily.  2. Return in 6 weeks.     Sean A. Everardo All, MD  Electronically Signed    SAE/MedQ  DD: 12/04/2006  DT: 12/05/2006  Job #: [id number]   cc:   Kirstie Peri, MD

## 2010-09-10 NOTE — Consult Note (Signed)
[facility]                            ENDOCRINOLOGY CONSULTATION   LILIE, VEZINA                  MRN:          [id number]  DATE:01/27/2006                            DOB:          1956-03-21    REFERRING PHYSICIAN:  Velora Heckler, MD   ENDOCRINOLOGY CONSULTATION   REASON FOR REFERRAL:  Thyroid cancer.   HISTORY OF PRESENT ILLNESS:  This 55 year old woman who was being evaluated  by Dr. Cleone Slim for possible metastatic breast cancer when she was noted to have  an abnormal image on her PET scan from the thyroid.  She underwent a thyroid  ultrasound, then biopsy, and then a thyroidectomy.   She was also noted to have mild postoperative hypocalcemia.  She denies  muscle cramps.   For her postoperative hypothyroidism, she is on no medication for this in  anticipation of radioiodine scanning.   PAST MEDICAL HISTORY:  1. Breast cancer.  2. Hypertension.  3. She says she has some type of lung disease due to her external beam      radiation therapy for her breast cancer.  4. Overactive bladder.  5. Dyslipidemia.  6. Urolithiasis.   SOCIAL HISTORY:  She is single and she is disabled.   FAMILY HISTORY:  Her mother had a goiter.   REVIEW OF SYSTEMS:  She has had slight weight gain and constipation since  her surgery.   PHYSICAL EXAMINATION:  Blood pressure 150/84, heart rate 63, temperature  97.5, the weight is 300.  GENERAL:  No distress.  SKIN:  Normal texture and temperature.  No rash.  HEENT:  No proptosis.  No periorbital swelling.  NECK:  There is a healing surgical scar present.  No lymphadenopathy is  present at the neck or at the supraclavicular area.  CHEST:  Clear to auscultation.  No respiratory distress.  CARDIOVASCULAR:  No JVD.  No edema.  Regular rate and rhythm.  No murmur.  NEUROLOGIC:  Alert and oriented.  Does not appear anxious, nor depressed and  sensation is diffusely intact to touch.   LABORATORY STUDIES:   On October 5 of 2007, TSH 120.   PATHOLOGY REPORT:  Shows 2 foci papillary adenocarcinoma, both in the left  thyroid lobe, 1 is 10 mm and the other is 8 mm.   IMPRESSION:  1. Two foci papillary adenocarcinoma of the thyroid, both in the left      lobe.  2. History of breast cancer.  3. She has the expected and desired postoperative hypothyroidism.  4. Mild postoperative hypocalcemia, which is usually transient.   PLAN:  1. A thyroid nuclear medicine body scan is ordered with the anticipation      of radioiodine ablation of the surgical remnant.  2. I told her that I think her thyroid cancer is exceedingly unlikely to      be related to either her breast cancer or to the radiation treatment      she had for this.  3. Check parathyroid hormone.            ______________________________  Cleophas Dunker Everardo All, MD     SAE/MedQ  DD:  01/29/2006  DT:  01/30/2006  Job #:  [id number]   cc:   Velora Heckler, MD  Kirstie Peri, MD  Lynett Fish, M.D.

## 2010-09-10 NOTE — Op Note (Signed)
NAME:  Amanda Meyers, Amanda Meyers           ACCOUNT NO.:  [id number]   MEDICAL RECORD NO.:  [id number]          PATIENT TYPE:  OIB   LOCATION:  1615                         [facility]ACILITY[facility]   PHYSICIAN:  Velora Heckler, MD      DATE OF BIRTH:  Sep 20, 1955   DATE OF PROCEDURE:  12/23/2005  DATE OF DISCHARGE:                                 OPERATIVE REPORT   PREOPERATIVE DIAGNOSIS:  Bilateral thyroid nodules with cytologic atypia.   POSTOPERATIVE DIAGNOSIS:  Bilateral thyroid nodules with cytologic atypia.   PROCEDURE:  Total thyroidectomy.   SURGEON:  Darnell Level, M.D., FACS   ASSISTANT:  Lebron Conners, M.D., FACS   ANESTHESIA:  General per Dr. Ronelle Nigh   ESTIMATED BLOOD LOSS:  Minimal.   PREPARATION:  Betadine.   COMPLICATIONS:  None.   INDICATIONS:  The patient is a 55 year old white female from [place], [place].  She has a past history of breast cancer.  As part of her follow-  up, she underwent a PET scan.  This demonstrated activity in the left lobe  of the thyroid.  Thyroid ultrasound was performed and showed bilateral  thyroid nodules.  Fine-needle aspiration was obtained and showed cytologic  atypia worrisome for papillary thyroid carcinoma.  The patient now comes to  surgery for total thyroidectomy for bilateral thyroid nodules with cytologic  atypia.   BODY OF REPORT:  Procedure was done in OR #3 at the [facility].  The patient was brought to the operating room and placed in  supine position on the operating room table.  Following administration of  general anesthesia, the patient was prepped and draped in usual strict  aseptic fashion.  After ascertaining that an adequate level of anesthesia  had been obtained, a Kocher incision was made with a #15 blade.  Dissection  was carried through subcutaneous tissues and platysma.  Hemostasis was  obtained with the electrocautery.  Skin flaps were elevated cephalad and  caudad from the thyroid  notch to the sternal notch.  A Mahorner self-  retaining retractor was placed for exposure.  Strap muscles were incised in  the midline, and dissection was begun on the left side of the neck.  Left  thyroid lobe was exposed.  Strap muscles were reflected laterally.  Middle  thyroid vein was divided between hemoclips.  Palpation revealed a hard firm  nodule in the upper pole.  There was a smaller, less discrete nodule in the  inferior pole.  Gland was mobilized with gentle blunt dissection with a  Administrator, [employer].  Inferior venous tributaries were divided between  Ligaclips.  Superior pole vessels were dissected out, ligated in continuity  with 2-0 silk ties and medium hemoclips and divided.  Gland was rolled  anteriorly.  Superior parathyroid gland was identified and preserved.  Branches of the inferior thyroid artery were divided between small  hemoclips.  Recurrent nerve was identified and preserved.  Inferior  parathyroid gland was dissected off the thyroid capsule and preserved.  Ligament of Allyson Sabal was transected with the electrocautery, and the gland was  rolled up and onto the  anterior surface of the trachea.  A small pyramidal  lobe was dissected out with the electrocautery and hemostasis obtained with  medium hemoclips.   Next, we turned our attention to the right thyroid lobe.  On palpation,  there was a hard, possibly calcified nodule in the inferior pole of the  right lobe.  Middle thyroid vein was again divided between hemoclips.  Superior pole vessels were dissected out, ligated in continuity with 2-0  silk ties and medium hemoclips and divided.  Gland was rolled anteriorly.  Inferior venous tributaries were divided between medium hemoclips.  Branches  of the inferior thyroid artery were divided between small hemoclips.  Ligament of Allyson Sabal was transected with electrocautery.  Gland was rolled  anteriorly.  Recurrent nerve was identified and preserved.  Parathyroid  tissue was  identified and preserved.  Gland was then excised off the  anterior trachea with electrocautery.  A suture was used to mark the left  superior pole.  The entire gland was submitted to pathology for review.  Neck was irrigated with warm saline which was evacuated.  Hemostasis was  obtained with the electrocautery.  Surgicel was placed over the area of the  recurrent nerves and parathyroid glands bilaterally.  Strap muscles were  reapproximated in the midline with interrupted 3-0 Vicryl sutures.  Platysma  was closed with interrupted 3-0 Vicryl sutures.  Skin was closed with a  running 4-0 Vicryl subcuticular suture.  Wound was washed and dried, and  Benzoin and Steri-Strips were applied.  Sterile dressings were applied.  The  patient was awakened from anesthesia and brought to the recovery room in  stable condition.  The patient tolerated the procedure well.      Velora Heckler, MD  Electronically Signed     TMG/MEDQ  D:  12/23/2005  T:  12/24/2005  Job:  [id number]   cc:   Lynett Fish, M.D.

## 2010-09-10 NOTE — Op Note (Signed)
[facility]  Patient:    Amanda Meyers, Amanda Meyers Visit Number: [id number] MRN: [id number]          Service Type: DSU Location: DAY Attending Physician:  Dalia Heading Dictated by:   Franky Macho, M.D. Proc. Date: 07/11/01 Admit Date:  07/11/2001 Discharge Date: 07/11/2001   CC:         Health Department  [facility]   Operative Report  AGE:  55 years old.  PREOPERATIVE DIAGNOSIS: Left breast carcinoma.  POSTOPERATIVE DIAGNOSIS: Left breast carcinoma.  PROCEDURE:  Port-A-Cath insertion.  SURGEON:  Franky Macho, M.D.  ANESTHESIA:  MAC.  INDICATIONS:  The patient is a 55 year old white female who recently underwent a left modified radical mastectomy for an infiltrating ductal carcinoma of the left breast.  She now presents for port-A-Cath insertion for chemotherapy. The risks and benefits of the procedure including bleeding, infection, and pneumothorax were fully explained to the patient.  I gave an informed consent.  PROCEDURE NOTE:  The patient was placed in the Trendelenburg position after anesthesia was administered.  1% Xylocaine was used for local anesthesia.  The left upper chest was prepped and draped using the usual sterile technique with Betadine.  A transverse incision was made just below the right clavicle.  A small subcutaneous pocket was formed.  A needle was then advanced into the right subclavian vein, and a guide wire was advanced into the superior vena cava under fluoroscopic guidance.  An introducer and peel-away sheath were then placed over the guide wire, and the catheter was inserted into the superior vena cava through the peel-away sheath.  The peel-away sheath was removed, and the catheter was attached to the port.  The port was placed in a subcutaneous pocket.  Adequate position was confirmed by fluoroscopy.  The port was flushed with 3,000 units of Heparin.  The subcutaneous tissue was reapproximated using a  4-0 Vicryl interrupted suture.  The skin was closed using a 4-0 Vicryl subcuticular suture.  Steri-Strips and dry, sterile dressing were applied.  All needle counts were correct at the end of the procedure.  The patient was transferred to PACU for a chest x-ray to confirm placement.  COMPLICATIONS:  None.  SPECIMENS:  None.  BLOOD LOSS:  Minimal. Dictated by:   Franky Macho, M.D. Attending Physician:  Dalia Heading DD:  07/11/01 TD:  07/13/01 Job: [id number]

## 2010-09-10 NOTE — Op Note (Signed)
[facility]  Patient:    KENZLIE, DISCH Visit Number: [id number] MRN: [id number]          Service Type: DSU Location: DAY Attending Physician:  Dalia Heading Dictated by:   Franky Macho, M.D. Proc. Date: 05/08/01 Admit Date:  05/08/2001   CC:         Health Department   Operative Report  PREOPERATIVE DIAGNOSIS:   Left breast mass.  POSTOPERATIVE DIAGNOSIS:  Left breast mass, greater than 5 cm.  OPERATION:  Left partial mastectomy.  SURGEON:  Franky Macho, M.D.  ANESTHESIA:  MAC.  COMPLICATIONS:  None.  SPECIMEN:  Left breast mass.  ESTIMATED BLOOD LOSS:  100 cc.  INDICATIONS:  The patient is a 55 year old white female who presents with an enlarging mass in the left breast.  It has been present for over one year. The risks and benefits of the procedure including bleeding and infection were fully explained to the patient who gave informed consent.  DESCRIPTION OF PROCEDURE:  The patient was placed in the supine position.  The left breast was prepped and draped using the usual sterile technique with Betadine.  1% Xylocaine was used for local anesthesia.  A transverse incision was made over the mass which was at the superior aspect of the left breast. Dissection was taken down to the mass.  The mass was very large, greater than 5 cm in size.  A full excision of the mass was performed.  The mass was sent to pathology for further examination.  Any bleeding was controlled using Bovie electrocautery.  The wound was irrigated with normal saline.  The skin was reapproximated using a 4-0 nylon interrupted suture.  Betadine ointment and a dry sterile dressing were applied.  All tape and needle counts were correct at the end of the procedure.  The patient was transferred to PACU in stable condition. Dictated by:   Franky Macho, M.D. Attending Physician:  Dalia Heading DD:  05/08/01 TD:  05/09/01 Job: [id number]

## 2010-09-10 NOTE — Op Note (Signed)
   NAME:  Amanda Meyers, GREENBERGER                     ACCOUNT NO.:  [id number]   MEDICAL RECORD NO.:  [id number]                   PATIENT TYPE:  AMB   LOCATION:  DAY                                  FACILITY:  [facility]   PHYSICIAN:  Dalia Heading, M.D.               DATE OF BIRTH:  [birth year]   DATE OF PROCEDURE:  07/08/2002  DATE OF DISCHARGE:                                 OPERATIVE REPORT   PREOPERATIVE DIAGNOSIS:  Left breast carcinoma, finished with chemotherapy.   POSTOPERATIVE DIAGNOSIS:  Left breast carcinoma, finished with chemotherapy.   PROCEDURE:  Port-A-Cath removal.   SURGEON:  Dalia Heading, M.D.   ANESTHESIA:  Local.   INDICATIONS FOR PROCEDURE:  The patient is a 55 year old white female who  presents for Port-A-Cath removal.  She is finished with her chemotherapy for  breast cancer.  The risks and benefits of the procedure, including bleeding  and infection, were fully explained to the patient who gave informed  consent.   DESCRIPTION OF PROCEDURE:  The patient was placed in the supine position.  The right Port-A-Cath site was prepped and draped using the usual sterile  technique with Betadine.  Surgical site confirmation was performed.  Xylocaine 1% was used for local anesthesia.   A transverse incision was made through the previous surgical scar.  This was  taken down to the port.  The port was removed in total without difficulty.  The subcutaneous layer was reapproximated using a 3-0 Vicryl interrupted  suture.  The skin was closed using a  4-0 subcuticular suture.  Steri-Strips and a dry sterile dressing were  applied.   All tape and needle counts were correct at the end of the procedure.  The  patient was discharged in stable condition.   COMPLICATIONS:  None.   SPECIMENS:  None.   ESTIMATED BLOOD LOSS:  Minimal.                                               Dalia Heading, M.D.    MAJ/MEDQ  D:  07/08/2002  T:  07/08/2002  Job:  [id number]

## 2010-09-10 NOTE — Consult Note (Signed)
[facility]                          ENDOCRINOLOGY CONSULTATION   Amanda Meyers, Amanda Meyers                  MRN:          [id number]  DATE:03/20/2006                            DOB:          [birth year]    REASON FOR VISIT:  Followup thyroid cancer.   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   Regarding her postoperative hypothyroidism, Dr. Gerrit Friends prescribed her  Synthroid 125 mcg a day, 8 days ago, and she is starting to feel much  better in general.   For her postoperative hypoparathyroidism, she takes calcium 1500 mg a  day.   PAST MEDICAL HISTORY:  Same as January 27, 2006.   REVIEW OF SYSTEMS:  She denies any change in her weight, she denies  muscle cramps.   PHYSICAL EXAMINATION:  VITAL SIGNS:  Blood pressure 113/80, heart rate  is 76, temperature is 96.7, the weight is 282.  GENERAL:  No distress.  NECK:  She has a healed surgical scar. No thyroid tissue is palpable.  There is no lymphadenopathy palpable at the neck.   LABORATORY DATA:  On January 27, 2006, calcium 8.5, parathyroid hormone  less than 2.5.   IMPRESSION:  1. Status post iodine-131 therapy for papillary adenocarcinoma of the      thyroid, after her surgery.  2. 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  3. 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   PLAN:  1. We discussed the natural history and followup procedures for her      cancer.  2. Same amount of Synthroid for now.  3. In about 30 days, please come in for a repeat TSH and parathyroid      level.  4. Continue the calcium for now.     Sean A. Everardo All, MD  Electronically Signed    SAE/MedQ  DD: 03/26/2006  DT: 03/27/2006  Job #: [id number]   cc:   Velora Heckler, MD  Kirstie Peri, MD

## 2010-09-10 NOTE — H&P (Signed)
[facility]  Patient:    Amanda Meyers, Amanda Meyers Visit Number: [id number] MRN: [id number]          Service Type: OUT Location: [facility] Attending Physician:  Dalia Heading Dictated by:   Franky Macho, M.D. Admit Date:  06/14/2001 Discharge Date: 06/14/2001   CC:         Health Department   History and Physical  DATE OF BIRTH:  March 31, 1956  CHIEF COMPLAINT:  Left breast carcinoma.  HISTORY OF PRESENT ILLNESS:  The patient is a 55 year old white female who recently underwent a left modified radical mastectomy for infiltrating ductal carcinoma of the left breast.  She now presents for a Port-A-Cath insertion.  PAST MEDICAL HISTORY: 1. As noted above. 2. Hypertension.  PAST SURGICAL HISTORY: 1. As noted above. 2. Hysterectomy in 1999. 3. Left breast biopsy in January 2003.  CURRENT MEDICATIONS:  Toprol XL 1 tablet p.o. q.d.  ALLERGIES:  No known drug allergies.  REVIEW OF SYSTEMS:  The patient denies any cardiopulmonary difficulties or bleeding disorders.  PHYSICAL EXAMINATION:  GENERAL:  Well-developed, well-nourished white female in no acute distress.  VITAL SIGNS:  Afebrile, vital signs stable.  NECK:  Supple.  Without lymphadenopathy.  LUNGS:  Clear to auscultation, with equal breath sounds bilaterally.  HEART:  Regular rate and rhythm without S3, S4, or murmurs.  BREASTS:  Right breast examination is unremarkable.  Left chest examination reveals a healed mastectomy scar.  IMPRESSION:  Left breast carcinoma.  PLAN:  The patient is scheduled for a Port-A-Cath insertion on July 11, 2001. The risks and benefits of the procedure including bleeding and pneumothorax were fully explained to the patient, who gave informed consent. Dictated by:   Franky Macho, M.D. Attending Physician:  Dalia Heading DD:  07/06/01 TD:  07/07/01 Job: [id number]

## 2010-09-10 NOTE — H&P (Signed)
   NAME:  Amanda Meyers, Amanda Meyers NO.:  [id number]   MEDICAL RECORD NO.:  [id number]                   PATIENT TYPE:   LOCATION:                                       FACILITY:   PHYSICIAN:  Dalia Heading, M.D.               DATE OF BIRTH:  03/13/1956   DATE OF ADMISSION:  DATE OF DISCHARGE:                                HISTORY & PHYSICAL   CHIEF COMPLAINT:  Left breast carcinoma, finished with chemotherapy.   HISTORY OF PRESENT ILLNESS:  The patient is a 55 year old white female who  recently finished chemotherapy for left breast carcinoma.  She now presents  for port-A-Cath removal.   PAST MEDICAL HISTORY:  Hypertension.   PAST SURGICAL HISTORY:  Left modified radical mastectomy in 3/03,  hysterectomy in 1999.   CURRENT MEDICATIONS:  Toprol XL 1 tablet p.o. daily.   ALLERGIES:  No known drug allergies.   REVIEW OF SYSTEMS:  Noncontributory.   PHYSICAL EXAMINATION:  GENERAL:  Well-developed, well-nourished white female  in no acute distress.  VITAL SIGNS:  Patient is afebrile and vital signs are stable.  LUNGS:  Clear to auscultation with equal breath sounds bilaterally.  HEART:  Regular rate and rhythm without S3, S4 or murmurs.  CHEST:  Of note, the patient has a port-A-Cath in place in the right upper  chest.   IMPRESSION:  Left breast carcinoma, finished with chemotherapy.   PLAN:  The patient is scheduled for port-A-Cath removal under local on  07/08/02.  The risks and benefits of the procedure were fully explained to  the patient, getting informed consent.                                                 Dalia Heading, M.D.    MAJ/MEDQ  D:  07/03/2002  T:  07/03/2002  Job:  [id number]   cc:   Health Department

## 2010-11-01 ENCOUNTER — Ambulatory Visit
Admission: RE | Admit: 2010-11-01 | Discharge: 2010-11-01 | Disposition: A | Discharge status: Home / Self Care | Payer: Medicaid Other | Source: Ambulatory Visit | Attending: Hematology and Oncology | Admitting: Hematology and Oncology

## 2010-11-01 DIAGNOSIS — Z1239 Encounter for other screening for malignant neoplasm of breast: Secondary | ICD-10-CM

## 2010-12-01 ENCOUNTER — Other Ambulatory Visit: Payer: Self-pay | Admitting: Hematology and Oncology

## 2010-12-01 DIAGNOSIS — Z9012 Acquired absence of left breast and nipple: Secondary | ICD-10-CM

## 2011-01-13 ENCOUNTER — Ambulatory Visit: Payer: Medicaid Other | Admitting: Endocrinology

## 2011-01-17 ENCOUNTER — Ambulatory Visit (INDEPENDENT_AMBULATORY_CARE_PROVIDER_SITE_OTHER): Payer: Medicaid Other | Admitting: Endocrinology

## 2011-01-17 ENCOUNTER — Encounter: Payer: Self-pay | Admitting: Endocrinology

## 2011-01-17 ENCOUNTER — Other Ambulatory Visit (INDEPENDENT_AMBULATORY_CARE_PROVIDER_SITE_OTHER): Payer: Medicaid Other

## 2011-01-17 DIAGNOSIS — C73 Malignant neoplasm of thyroid gland: Secondary | ICD-10-CM

## 2011-01-17 DIAGNOSIS — E209 Hypoparathyroidism, unspecified: Secondary | ICD-10-CM

## 2011-01-17 DIAGNOSIS — E890 Postprocedural hypothyroidism: Secondary | ICD-10-CM

## 2011-01-17 LAB — BASIC METABOLIC PANEL
CO2: 33 — ABNORMAL HIGH
Calcium: 8.6
Creatinine, Ser: 0.86
GFR calc non Af Amer: >60
Glucose, Bld: 54 — ABNORMAL LOW
Sodium: 141

## 2011-01-17 NOTE — Progress Notes (Signed)
  Subjective:    Patient ID: Amanda Meyers, female    DOB: 1955/11/15, 55 y.o.   MRN: [id number]  HPI The state of at least three ongoing medical problems is addressed today: stage 1 papillary adenocarcinoma thyroid.  Pt says she does not notice any nodule at the anterior neck.      8/07    thyroidect for suspicious nodules      8/07    pathol: 2 foci left lobe (10 and 8 mm)      11/07  131-i rx [facility]       7/08    neg thyrogen scan      8/08    thyroglobulin=0.7 (antibody neg)      4/09    tg and anti-tg antibody are neg     10/09   tg and anti-tg antibody are neg      4/10    tg and anti-tg antibody are neg      4/11    tg and anti-tg antibody are neg      9/12    tg and anti-tg antibody are neg Postsurgical hypothyroidism.  She denies weight change Postsurgical hypoparathyroidism: she denies cramps Review of Systems Denies depression, and weakness.      Objective:   Physical Exam VITAL SIGNS:  See vs page GENERAL: no distress Neck: a healed scar is present.  i do not appreciate a nodule in the thyroid or elsewhere in the neck    tg and anti-tg are undetectable Lab Results  Component Value Date   TSH 0.17* 01/17/2011  pth is low.  Ca++ is normal    Assessment & Plan:  Stage-1 papillary adenocarcinoma of the thyroid.  No evidence of recurrence Post-i-131 hypothyroidism.  Given the 5-year disease-free interval, she can now shoot for a normal tsh Postsurgical hypoparathyroidism, with normal ca++ level now.

## 2011-01-17 NOTE — Patient Instructions (Addendum)
blood tests are being requested for you today.  please call [phone] to hear your test results.  You will be prompted to enter the 9-digit "MRN" number that appears at the top left of this page, followed by #.  Then you will hear the message. your thyroid cancer was a stage-1, and it has been 5 years since we have found any cancer.  Therefore, you only need to take enough levothyroxine to keep your thyroid blood test normal. Please return in 1 year. (update: i left message on phone-tree:  Reduce synthroid to 100/d).

## 2011-01-18 LAB — PTH, INTACT AND CALCIUM
Calcium, Total (PTH): 9.6 mg/dL (ref 8.4–10.5)
PTH: 3.1 pg/mL — ABNORMAL LOW (ref 14.0–72.0)

## 2011-01-18 MED ORDER — LEVOTHYROXINE SODIUM 100 MCG PO TABS: 100.0000 ug | ORAL_TABLET | Freq: Every day | ORAL | Status: DC

## 2011-01-20 LAB — BASIC METABOLIC PANEL
BUN: 8
GFR calc Af Amer: >60
GFR calc non Af Amer: >60
Potassium: 3.6
Sodium: 144

## 2011-01-20 LAB — POCT HEMOGLOBIN-HEMACUE: Hemoglobin: 12.1

## 2011-01-25 ENCOUNTER — Other Ambulatory Visit (HOSPITAL_COMMUNITY): Payer: Self-pay | Admitting: Urology

## 2011-01-25 ENCOUNTER — Ambulatory Visit (HOSPITAL_COMMUNITY)
Admission: RE | Admit: 2011-01-25 | Discharge: 2011-01-25 | Disposition: A | Discharge status: Home / Self Care | Payer: Medicaid Other | Source: Ambulatory Visit | Attending: Urology | Admitting: Urology

## 2011-01-25 DIAGNOSIS — N200 Calculus of kidney: Secondary | ICD-10-CM

## 2011-01-25 DIAGNOSIS — R109 Unspecified abdominal pain: Secondary | ICD-10-CM | POA: Insufficient documentation

## 2011-02-17 ENCOUNTER — Ambulatory Visit (INDEPENDENT_AMBULATORY_CARE_PROVIDER_SITE_OTHER): Payer: Medicaid Other | Admitting: *Deleted

## 2011-02-17 DIAGNOSIS — Z23 Encounter for immunization: Secondary | ICD-10-CM

## 2011-07-12 ENCOUNTER — Other Ambulatory Visit (HOSPITAL_COMMUNITY): Payer: Self-pay | Admitting: Urology

## 2011-07-12 ENCOUNTER — Ambulatory Visit (HOSPITAL_COMMUNITY)
Admission: RE | Admit: 2011-07-12 | Discharge: 2011-07-12 | Disposition: A | Discharge status: Home / Self Care | Payer: Medicaid Other | Source: Ambulatory Visit | Attending: Urology | Admitting: Urology

## 2011-07-12 DIAGNOSIS — N200 Calculus of kidney: Secondary | ICD-10-CM | POA: Insufficient documentation

## 2011-07-12 DIAGNOSIS — N201 Calculus of ureter: Secondary | ICD-10-CM

## 2011-11-02 ENCOUNTER — Ambulatory Visit
Admission: RE | Admit: 2011-11-02 | Discharge: 2011-11-02 | Disposition: A | Discharge status: Home / Self Care | Payer: Medicaid Other | Source: Ambulatory Visit | Attending: Hematology and Oncology | Admitting: Hematology and Oncology

## 2011-11-02 DIAGNOSIS — Z9012 Acquired absence of left breast and nipple: Secondary | ICD-10-CM

## 2011-11-17 ENCOUNTER — Encounter: Payer: Medicaid Other | Admitting: Hematology and Oncology

## 2011-12-01 ENCOUNTER — Encounter (INDEPENDENT_AMBULATORY_CARE_PROVIDER_SITE_OTHER): Payer: Medicaid Other | Admitting: Hematology and Oncology

## 2011-12-01 ENCOUNTER — Other Ambulatory Visit: Payer: Self-pay | Admitting: Hematology and Oncology

## 2011-12-01 DIAGNOSIS — Z86718 Personal history of other venous thrombosis and embolism: Secondary | ICD-10-CM

## 2011-12-01 DIAGNOSIS — N63 Unspecified lump in unspecified breast: Secondary | ICD-10-CM

## 2011-12-01 DIAGNOSIS — C73 Malignant neoplasm of thyroid gland: Secondary | ICD-10-CM

## 2011-12-01 DIAGNOSIS — C773 Secondary and unspecified malignant neoplasm of axilla and upper limb lymph nodes: Secondary | ICD-10-CM

## 2011-12-01 DIAGNOSIS — C50919 Malignant neoplasm of unspecified site of unspecified female breast: Secondary | ICD-10-CM

## 2011-12-12 ENCOUNTER — Ambulatory Visit
Admission: RE | Admit: 2011-12-12 | Discharge: 2011-12-12 | Disposition: A | Discharge status: Home / Self Care | Payer: Medicaid Other | Source: Ambulatory Visit | Attending: Hematology and Oncology | Admitting: Hematology and Oncology

## 2011-12-12 DIAGNOSIS — N63 Unspecified lump in unspecified breast: Secondary | ICD-10-CM

## 2011-12-30 ENCOUNTER — Other Ambulatory Visit: Payer: Self-pay | Admitting: Hematology and Oncology

## 2011-12-30 ENCOUNTER — Encounter (INDEPENDENT_AMBULATORY_CARE_PROVIDER_SITE_OTHER): Payer: Medicaid Other | Admitting: Hematology and Oncology

## 2011-12-30 DIAGNOSIS — G4730 Sleep apnea, unspecified: Secondary | ICD-10-CM

## 2011-12-30 DIAGNOSIS — Z1231 Encounter for screening mammogram for malignant neoplasm of breast: Secondary | ICD-10-CM

## 2011-12-30 DIAGNOSIS — C50919 Malignant neoplasm of unspecified site of unspecified female breast: Secondary | ICD-10-CM

## 2011-12-30 DIAGNOSIS — N189 Chronic kidney disease, unspecified: Secondary | ICD-10-CM

## 2012-01-12 ENCOUNTER — Other Ambulatory Visit (HOSPITAL_COMMUNITY): Payer: Self-pay | Admitting: Urology

## 2012-01-12 ENCOUNTER — Ambulatory Visit (HOSPITAL_COMMUNITY)
Admission: RE | Admit: 2012-01-12 | Discharge: 2012-01-12 | Disposition: A | Discharge status: Home / Self Care | Payer: Medicaid Other | Source: Ambulatory Visit | Attending: Urology | Admitting: Urology

## 2012-01-12 DIAGNOSIS — N200 Calculus of kidney: Secondary | ICD-10-CM

## 2012-01-12 DIAGNOSIS — Z09 Encounter for follow-up examination after completed treatment for conditions other than malignant neoplasm: Secondary | ICD-10-CM | POA: Insufficient documentation

## 2012-01-18 ENCOUNTER — Other Ambulatory Visit: Payer: Self-pay | Admitting: *Deleted

## 2012-01-18 MED ORDER — LEVOTHYROXINE SODIUM 100 MCG PO TABS: 100.0000 ug | ORAL_TABLET | Freq: Every day | ORAL | Status: DC

## 2012-01-18 NOTE — Telephone Encounter (Signed)
R'cd fax from [facility] for refill of Levothyroxine.

## 2012-01-19 ENCOUNTER — Encounter: Payer: Self-pay | Admitting: Endocrinology

## 2012-01-19 ENCOUNTER — Ambulatory Visit (INDEPENDENT_AMBULATORY_CARE_PROVIDER_SITE_OTHER): Payer: Medicaid Other | Admitting: Endocrinology

## 2012-01-19 ENCOUNTER — Other Ambulatory Visit (INDEPENDENT_AMBULATORY_CARE_PROVIDER_SITE_OTHER): Payer: Medicaid Other

## 2012-01-19 VITALS — BP 130/80 | HR 94 | Temp 97.2°F | Wt 218.0 lb

## 2012-01-19 DIAGNOSIS — E209 Hypoparathyroidism, unspecified: Secondary | ICD-10-CM

## 2012-01-19 DIAGNOSIS — E890 Postprocedural hypothyroidism: Secondary | ICD-10-CM

## 2012-01-19 DIAGNOSIS — C73 Malignant neoplasm of thyroid gland: Secondary | ICD-10-CM

## 2012-01-19 LAB — TSH: TSH: 0.98 u[IU]/mL (ref 0.35–5.50)

## 2012-01-19 NOTE — Patient Instructions (Addendum)
blood tests are being requested for you today.  You will be contacted with results. Please return in 1 year. (update: same rx)

## 2012-01-19 NOTE — Progress Notes (Signed)
  Subjective:    Patient ID: Amanda Meyers, female    DOB: 1955/11/02, 56 y.o.   MRN: [id number]  HPI The state of at least three ongoing medical problems is addressed today: stage 1 papillary adenocarcinoma thyroid.  Pt says she does not notice any nodule at the anterior neck.      8/07    thyroidect for suspicious nodules      8/07    pathol: 2 foci left lobe (10 and 8 mm)      11/07  131-i rx [facility]       7/08    neg thyrogen scan      8/08    thyroglobulin=0.7 (antibody neg)      4/09    tg and anti-tg antibody are neg     10/09   tg and anti-tg antibody are neg      4/10    tg and anti-tg antibody are neg      4/11    tg and anti-tg antibody are neg      9/12    tg and anti-tg antibody are neg Postsurgical hypothyroidism: Denies weight change Postsurgical hypoparathyroidism: Denies muscle cramps  Review of Systems No change in chronic numbness of the legs and feet.  Denies neck pain    Objective:   Physical Exam VITAL SIGNS:  See vs page GENERAL: no distress Neck: a healed scar is present.  i do not appreciate a nodule in the thyroid or elsewhere in the neck   TG is undetectable Lab Results  Component Value Date   TSH 0.98 01/19/2012   Lab Results  Component Value Date   PTH 5.9* 01/19/2012   CALCIUM 9.5 01/19/2012   CAION 2.39 CRITICAL RESULT CALLED TO, READ BACK BY AND VERIFIED WITH: S CARTER,RN 1700 11/25/09 WBOND* 11/25/2009   PHOS 2.7 11/28/2009      Assessment & Plan:  Hypoparathyroidism, well-controlled Thyroid cancer, no evidence of recurrence Postsurgical hypothyroidism, well-replaced

## 2012-01-20 ENCOUNTER — Encounter: Payer: Self-pay | Admitting: Endocrinology

## 2012-01-20 LAB — THYROGLOBULIN LEVEL: Thyroglobulin: <0.2 ng/mL (ref 0.0–55.0)

## 2012-01-23 DIAGNOSIS — Z23 Encounter for immunization: Secondary | ICD-10-CM

## 2012-01-23 DIAGNOSIS — C50919 Malignant neoplasm of unspecified site of unspecified female breast: Secondary | ICD-10-CM

## 2012-05-01 ENCOUNTER — Other Ambulatory Visit: Payer: Self-pay | Admitting: Physician Assistant

## 2012-05-11 ENCOUNTER — Other Ambulatory Visit: Payer: Self-pay | Admitting: Endocrinology

## 2012-06-21 ENCOUNTER — Other Ambulatory Visit: Payer: Self-pay | Admitting: Dermatology

## 2012-10-03 ENCOUNTER — Other Ambulatory Visit: Payer: Self-pay | Admitting: Endocrinology

## 2012-10-03 ENCOUNTER — Other Ambulatory Visit: Payer: Self-pay | Admitting: *Deleted

## 2012-10-03 MED ORDER — LEVOTHYROXINE SODIUM 100 MCG PO TABS: 100.0000 ug | ORAL_TABLET | Freq: Every day | ORAL | Status: DC

## 2012-11-30 ENCOUNTER — Other Ambulatory Visit: Payer: Self-pay | Admitting: *Deleted

## 2012-11-30 ENCOUNTER — Other Ambulatory Visit: Payer: Self-pay | Admitting: Endocrinology

## 2012-11-30 MED ORDER — LEVOTHYROXINE SODIUM 100 MCG PO TABS: 100.0000 ug | ORAL_TABLET | Freq: Every day | ORAL | Status: DC

## 2012-12-12 ENCOUNTER — Ambulatory Visit
Admission: RE | Admit: 2012-12-12 | Discharge: 2012-12-12 | Disposition: A | Discharge status: Home / Self Care | Payer: Medicaid Other | Source: Ambulatory Visit | Attending: Hematology and Oncology | Admitting: Hematology and Oncology

## 2012-12-12 DIAGNOSIS — Z1231 Encounter for screening mammogram for malignant neoplasm of breast: Secondary | ICD-10-CM

## 2013-01-14 ENCOUNTER — Other Ambulatory Visit: Payer: Self-pay

## 2013-01-14 DIAGNOSIS — Z1231 Encounter for screening mammogram for malignant neoplasm of breast: Secondary | ICD-10-CM

## 2013-01-14 DIAGNOSIS — Z23 Encounter for immunization: Secondary | ICD-10-CM

## 2013-01-14 DIAGNOSIS — C73 Malignant neoplasm of thyroid gland: Secondary | ICD-10-CM

## 2013-01-14 DIAGNOSIS — Z9012 Acquired absence of left breast and nipple: Secondary | ICD-10-CM

## 2013-01-14 DIAGNOSIS — Z853 Personal history of malignant neoplasm of breast: Secondary | ICD-10-CM

## 2013-01-14 DIAGNOSIS — G589 Mononeuropathy, unspecified: Secondary | ICD-10-CM

## 2013-01-18 ENCOUNTER — Ambulatory Visit (INDEPENDENT_AMBULATORY_CARE_PROVIDER_SITE_OTHER): Payer: Medicaid Other | Admitting: Endocrinology

## 2013-01-18 VITALS — BP 130/70 | HR 76 | Wt 190.0 lb

## 2013-01-18 DIAGNOSIS — C73 Malignant neoplasm of thyroid gland: Secondary | ICD-10-CM

## 2013-01-18 DIAGNOSIS — E890 Postprocedural hypothyroidism: Secondary | ICD-10-CM

## 2013-01-18 DIAGNOSIS — E209 Hypoparathyroidism, unspecified: Secondary | ICD-10-CM

## 2013-01-18 NOTE — Patient Instructions (Addendum)
blood tests are being requested for you today.  We'll contact you with results.  It is getting more unlikely that we'll ever hear from your thyroid cancer again.    Please return in 1 year.  

## 2013-01-18 NOTE — Progress Notes (Signed)
Subjective:    Patient ID: Amanda Meyers, female    DOB: 1956-01-08, 57 y.o.   MRN: [id number]  HPI The state of at least three ongoing medical problems is addressed today: stage 1 papillary adenocarcinoma thyroid.  Pt says she does not notice any nodule at the anterior neck.      8/07    thyroidect for suspicious nodules      8/07    pathol: 2 foci left lobe (10 and 8 mm)      11/07  131-i rx [facility]       7/08    neg thyrogen scan      8/08    thyroglobulin=0.7 (antibody neg)      4/09    tg and anti-tg antibody are neg     10/09   tg and anti-tg antibody are neg      4/10    tg and anti-tg antibody are neg      4/11    tg and anti-tg antibody are neg      9/12    tg and anti-tg antibody are neg Postsurgical hypothyroidism: Denies weight change Postsurgical hypoparathyroidism: Denies muscle cramps.   She takes a Ca++ supplement, 1-BID.   Past Medical History  Diagnosis Date  . Cancer     Breast  . History of DVT (deep vein thrombosis)   . Hypertension   . Lung disease   . Dyslipidemia   . Urolithiasis   . Urinary incontinence   . Hypothyroidism   . Surgical hypoparathyroidism   . History of papillary adenocarcinoma of thyroid     Stage 1  . S/P thyroidectomy 11/2005    Suspicious nodules, path- 2 foci left lobe (10 &27mm) I-131 Rx 02/2006, neg thyrogen scan 10/2006, thyroglobulin  0.7 neg antibody 11/2006 07/2007 01/2008 07/2008 07/2009    No past surgical history on file.  History   Social History  . Marital Status: Single    Spouse Name: N/A    Number of Children: N/A  . Years of Education: N/A   Occupational History  . Not on file.   Social History Main Topics  . Smoking status: Never Smoker   . Smokeless tobacco: Not on file  . Alcohol Use: No  . Drug Use: Not on file  . Sexual Activity: Not on file   Other Topics Concern  . Not on file   Social History Narrative   Lives at [place], [place])    Current Outpatient Prescriptions on File  Prior to Visit  Medication Sig Dispense Refill  . anastrozole (ARIMIDEX) 1 MG tablet Take 1 mg by mouth daily.        Marland Kitchen aspirin 81 MG EC tablet Take 81 mg by mouth daily.        Marland Kitchen atorvastatin (LIPITOR) 20 MG tablet Take 20 mg by mouth daily.        . cycloSPORINE (RESTASIS) 0.05 % ophthalmic emulsion Place 1 drop into both eyes 2 (two) times daily.        Marland Kitchen doxycycline (VIBRA-TABS) 100 MG tablet Take 100 mg by mouth daily.        Marland Kitchen levothyroxine (SYNTHROID, LEVOTHROID) 100 MCG tablet Take 1 tablet (100 mcg total) by mouth daily before breakfast.  30 tablet  1  . metoprolol (TOPROL-XL) 100 MG 24 hr tablet Take 100 mg by mouth daily.        Marland Kitchen omeprazole (PRILOSEC) 20 MG capsule Take 20 mg by mouth daily.        Marland Kitchen  phentermine (ADIPEX-P) 37.5 MG tablet Take 37.5 mg by mouth every morning before breakfast.        . solifenacin (VESICARE) 10 MG tablet Take 5 mg by mouth daily.         No current facility-administered medications on file prior to visit.   No Known Allergies  No family history on file.  BP 130/70  Pulse 76  Wt 190 lb (86.183 kg)  BMI 28.9 kg/m2  SpO2 97%  Review of Systems No change in chronic numbness of the feet.  She has dry skin.     Objective:   Physical Exam VITAL SIGNS:  See vs page GENERAL: no distress Neck: a healed scar is present.  i do not appreciate a nodule in the thyroid or elsewhere in the neck.   Lab Results  Component Value Date   TSH 0.77 01/18/2013      Assessment & Plan:  Papillary adenocarcinoma of the thyroid: no clinical evidence of recurrence. Postsurgical hypothyroidism: well-replaced Hypoparathyroidism: well-controlled in just a Ca++ supplement.

## 2013-01-20 ENCOUNTER — Other Ambulatory Visit: Payer: Self-pay | Admitting: Endocrinology

## 2013-01-21 LAB — PTH, INTACT AND CALCIUM: Calcium: 9.3 mg/dL (ref 8.4–10.5)

## 2013-01-23 ENCOUNTER — Other Ambulatory Visit: Payer: Self-pay | Admitting: Endocrinology

## 2013-02-22 ENCOUNTER — Other Ambulatory Visit: Payer: Self-pay | Admitting: Endocrinology

## 2013-02-25 ENCOUNTER — Other Ambulatory Visit: Payer: Self-pay | Admitting: Endocrinology

## 2013-06-17 ENCOUNTER — Other Ambulatory Visit: Payer: Self-pay | Admitting: Endocrinology

## 2013-06-25 ENCOUNTER — Encounter: Payer: Self-pay | Admitting: *Deleted

## 2013-07-19 ENCOUNTER — Ambulatory Visit (INDEPENDENT_AMBULATORY_CARE_PROVIDER_SITE_OTHER): Payer: Medicaid Other | Admitting: Diagnostic Neuroimaging

## 2013-07-19 ENCOUNTER — Encounter: Payer: Self-pay | Admitting: Diagnostic Neuroimaging

## 2013-07-19 ENCOUNTER — Encounter (INDEPENDENT_AMBULATORY_CARE_PROVIDER_SITE_OTHER): Payer: Self-pay

## 2013-07-19 VITALS — BP 133/92 | HR 64 | Temp 97.8°F | Ht 68.0 in | Wt 197.5 lb

## 2013-07-19 DIAGNOSIS — Q670 Congenital facial asymmetry: Secondary | ICD-10-CM

## 2013-07-19 DIAGNOSIS — Q674 Other congenital deformities of skull, face and jaw: Secondary | ICD-10-CM

## 2013-07-19 DIAGNOSIS — R200 Anesthesia of skin: Secondary | ICD-10-CM

## 2013-07-19 DIAGNOSIS — R209 Unspecified disturbances of skin sensation: Secondary | ICD-10-CM

## 2013-07-19 DIAGNOSIS — H02409 Unspecified ptosis of unspecified eyelid: Secondary | ICD-10-CM

## 2013-07-19 DIAGNOSIS — H532 Diplopia: Secondary | ICD-10-CM

## 2013-07-19 DIAGNOSIS — H02402 Unspecified ptosis of left eyelid: Secondary | ICD-10-CM

## 2013-07-19 DIAGNOSIS — H538 Other visual disturbances: Secondary | ICD-10-CM

## 2013-07-19 NOTE — Progress Notes (Signed)
[facility]  PATIENT: Amanda Meyers DOB: 05-21-1955  REFERRING CLINICIAN: Manuella Ghazi  HISTORY FROM: patient  REASON FOR VISIT: new consult   HISTORICAL  CHIEF COMPLAINT:  Chief Complaint  Patient presents with  . Neurologic Problem    L eye drooping    HISTORY OF PRESENT ILLNESS:   58 year old right-handed female here for evaluation of left facial asymmetry.  2 months ago patient noticed gradual onset of decreased vision in her left eye. Patient went to her eye doctor who noticed left eye drooping and left facial asymmetry, and was concerned for stroke or Bell's palsy as an etiology. Patient was referred to me for further evaluation.  Since that time patient has noted also looked gradual left eyelid drooping, left. Orbital swelling, left facial drooping, decreased sensation in the left face, left arm and left leg. Patient has noted some hoarse voice as well.  Business past medical history of breast cancer and thyroid cancer status post surgery and radiation treatment. Patient thinks she had some "nerve damage" related to prior cancer treatment.  Patient denies sudden onset symptoms on one side of her body. She notes some gradual and fluctuating symptoms ongoing.  REVIEW OF SYSTEMS: Full 14 system review of systems performed and notable only for fatigue swelling in legs moles incontinence Raynaud's joint pain incontinence snoring eye pain blurred vision.  ALLERGIES: No Known Allergies  HOME MEDICATIONS: Outpatient Prescriptions Prior to Visit  Medication Sig Dispense Refill  . anastrozole (ARIMIDEX) 1 MG tablet Take 1 mg by mouth daily.        Marland Kitchen aspirin 81 MG EC tablet Take 81 mg by mouth daily.        Marland Kitchen atorvastatin (LIPITOR) 20 MG tablet Take 20 mg by mouth daily.        . cycloSPORINE (RESTASIS) 0.05 % ophthalmic emulsion Place 1 drop into both eyes 2 (two) times daily.        Marland Kitchen doxycycline (VIBRA-TABS) 100 MG tablet Take 100 mg by mouth daily.         Marland Kitchen levothyroxine (SYNTHROID, LEVOTHROID) 100 MCG tablet TAKE 1 TABLET BY MOUTH EVERY DAY BEFORE BREAKFAST  30 tablet  4  . metoprolol (TOPROL-XL) 100 MG 24 hr tablet Take 100 mg by mouth daily.        Marland Kitchen omeprazole (PRILOSEC) 20 MG capsule Take 20 mg by mouth daily.        . phentermine (ADIPEX-P) 37.5 MG tablet Take 37.5 mg by mouth every morning before breakfast.        . solifenacin (VESICARE) 10 MG tablet Take 5 mg by mouth daily.         No facility-administered medications prior to visit.    PAST MEDICAL HISTORY: Past Medical History  Diagnosis Date  . Cancer     Breast  . History of DVT (deep vein thrombosis)   . Hypertension   . Lung disease   . Dyslipidemia   . Urolithiasis   . Urinary incontinence   . Hypothyroidism   . Surgical hypoparathyroidism   . History of papillary adenocarcinoma of thyroid     Stage 1  . S/P thyroidectomy 11/2005    Suspicious nodules, path- 2 foci left lobe (10 &41m) I-131 Rx 02/2006, neg thyrogen scan 10/2006, thyroglobulin  0.7 neg antibody 11/2006 07/2007 01/2008 07/2008 07/2009    PAST SURGICAL HISTORY: Past Surgical History  Procedure Laterality Date  . Thyroidectomy  2007  . Mastectomy Left 2002    FAMILY HISTORY: Family  History  Problem Relation Age of Onset  . Stroke Father   . Stroke Maternal Grandmother   . Stroke Maternal Grandfather   . Stroke Paternal Grandmother   . Stroke Paternal Grandfather     SOCIAL HISTORY:  History   Social History  . Marital Status: Single    Spouse Name: N/A    Number of Children: 0  . Years of Education: HS   Occupational History  .     Social History Main Topics  . Smoking status: Never Smoker   . Smokeless tobacco: Never Used  . Alcohol Use: No  . Drug Use: No  . Sexual Activity: Not on file   Other Topics Concern  . Not on file   Social History Narrative   Lives at [place], [place])   Patient lives at home with her mother 07/19/13   Caffeine Use: 3-4 cups daily       PHYSICAL EXAM  Filed Vitals:   07/19/13 1040  BP: 133/92  Pulse: 64  Temp: 97.8 F (36.6 C)  TempSrc: Oral  Height: _0  (1.727 m)  Weight: 197 lb 8 oz (89.585 kg)    Not recorded    Body mass index is 30.04 kg/(m^2).  GENERAL EXAM: Patient is in no distress; well developed, nourished and groomed; neck is supple  CARDIOVASCULAR: Regular rate and rhythm, no murmurs, no carotid bruits  NEUROLOGIC: MENTAL STATUS: awake, alert, oriented to person, place and time, recent and remote memory intact, normal attention and concentration, language fluent, comprehension intact, naming intact, fund of knowledge appropriate CRANIAL NERVE: no papilledema on fundoscopic exam, pupils equal and reactive to light, visual fields full to confrontation, RIGHT EYE 20/30, LEFT EYE 20/UNABLE; ABLE TO COUNT FINGERS IN RIGHT AND LEFT EYES. EOM TESTING NOTABLE FOR DECREASE ABDUCTION OF LEFT EYE ON LEFT GAZE WITH SUBJECTIVE BINOCULAR HORIZONTAL DIPLOPIA ON LEFT GAZE. BASELINE LEFT PTOSIS, WHICH SLIGHTLY WORSENS AFTER 1 MINUTE UPGASZE. No nystagmus. DECR SENS IN LEFT V2, V3. SLIGHTLY DECR LEFT NASOLABIAL FOLD. Hearing intact, palate elevates symmetrically, uvula midline, shoulder shrug symmetric, tongue midline. NASAL VOICE, SLIGHTLY HOARSE. MOTOR: normal bulk and tone, BUE (D 5, B 4, T 3-4, GRIP 4). BLE (HF 4, KE/KF 4, DF 5).  SENSORY: normal and symmetric to light touch, pinprick, temperature, vibration COORDINATION: finger-nose-finger, fine finger movements normal REFLEXES: BUE 1, BLE 0 GAIT/STATION: SLOW TO RISE, STOOPED POSTURE, SLOW UNSTEADY GAIT.     DIAGNOSTIC DATA (LABS, IMAGING, TESTING) - I reviewed patient records, labs, notes, testing and imaging myself where available.  Lab Results  Component Value Date   WBC 5.7 02/05/2010   HGB 12.0 02/05/2010   HCT 37.4 02/05/2010   MCV 96.6 02/05/2010   PLT 283 02/05/2010      Component Value Date/Time   NA 139 02/05/2010 1312   K 4.7  02/05/2010 1312   CL 103 02/05/2010 1312   CO2 29 02/05/2010 1312   GLUCOSE 92 02/05/2010 1312   BUN 18 02/05/2010 1312   CREATININE 1.17 02/05/2010 1312   CALCIUM 9.3 01/18/2013 1428   CALCIUM 9.5 01/19/2012 1647   PROT 6.0 11/25/2009 0330   ALBUMIN 2.4* 11/28/2009 0500   AST 21 11/25/2009 0330   ALT 17 11/25/2009 0330   ALKPHOS 66 11/25/2009 0330   BILITOT 0.9 11/25/2009 0330   GFRNONAA 48* 02/05/2010 1312   GFRAA  Value: 58        The eGFR has been calculated using the MDRD equation. This calculation has not been  validated in all clinical situations. eGFR's persistently <60 mL/min signify possible Chronic Kidney Disease.* 02/05/2010 1312   No results found for this basename: CHOL, HDL, LDLCALC, LDLDIRECT, TRIG, CHOLHDL   No results found for this basename: HGBA1C   Lab Results  Component Value Date   VITAMINB12 1803* 11/25/2009   Lab Results  Component Value Date   TSH 0.77 01/18/2013      ASSESSMENT AND PLAN  58 y.o. year old female here with gradual onset decreased visual acuity in the left eye over past 2 months. Around the same time also developed left eyelid drooping, left eyelid swelling, numbness in the left face, numbness in the left hand. Exam notable for left ptosis, left lateral rectus weakness, left periorbital swelling, decreased sensation left face, diffuse hyporeflexia, and generalized weakness. Gradual onset of symptosm makes stroke less likely. I am more concerned about inflammatory, autoimmune or neoplastic etiologies.  Ddx: neuromuscular disease (myasthenia gravis, myopathy, neuropathy) vs. CNS (vascular, inflamm) vs. orbital pathology (pseudtumor of orbit, neoplastic, vascular)   PLAN: Orders Placed This Encounter  Procedures  . MR Brain W Wo Contrast  . MR Orbits WO/W Cm  . Vitamin B12  . Hemoglobin A1c  . Acetylcholine Receptor, Binding  . CK  . Aldolase  . Sedimentation rate  . C-reactive protein   Return in about 6 weeks (around 08/30/2013).    Penni Bombard, MD 0/80/2233, 61:22 AM Certified in Neurology, Neurophysiology and Neuroimaging  [facility] [address] [place], [place] [address] [phone]

## 2013-07-19 NOTE — Patient Instructions (Signed)
I will check add'l testing. 

## 2013-07-23 LAB — SEDIMENTATION RATE: SED RATE: 2 mm/h (ref 0–40)

## 2013-07-23 LAB — HEMOGLOBIN A1C
ESTIMATED AVERAGE GLUCOSE: 108 mg/dL
Hgb A1c MFr Bld: 5.4 % (ref 4.8–5.6)

## 2013-07-23 LAB — ALDOLASE: ALDOLASE: 4.4 U/L (ref 3.3–10.3)

## 2013-07-23 LAB — C-REACTIVE PROTEIN: CRP: 1 mg/L (ref 0.0–4.9)

## 2013-07-23 LAB — CK: Total CK: 249 U/L — ABNORMAL HIGH (ref 24–173)

## 2013-07-23 LAB — ACETYLCHOLINE RECEPTOR, BINDING

## 2013-07-23 LAB — VITAMIN B12: Vitamin B-12: 413 pg/mL (ref 211–946)

## 2013-07-30 ENCOUNTER — Other Ambulatory Visit: Payer: Medicaid Other

## 2013-07-30 ENCOUNTER — Ambulatory Visit
Admission: RE | Admit: 2013-07-30 | Discharge: 2013-07-30 | Disposition: A | Discharge status: Home / Self Care | Payer: Medicaid Other | Source: Ambulatory Visit | Attending: Diagnostic Neuroimaging | Admitting: Diagnostic Neuroimaging

## 2013-07-30 DIAGNOSIS — H532 Diplopia: Secondary | ICD-10-CM

## 2013-07-30 DIAGNOSIS — H538 Other visual disturbances: Secondary | ICD-10-CM

## 2013-07-30 DIAGNOSIS — Q670 Congenital facial asymmetry: Secondary | ICD-10-CM

## 2013-07-30 DIAGNOSIS — H02402 Unspecified ptosis of left eyelid: Secondary | ICD-10-CM

## 2013-07-30 DIAGNOSIS — R200 Anesthesia of skin: Secondary | ICD-10-CM

## 2013-07-30 MED ORDER — GADOBENATE DIMEGLUMINE 529 MG/ML IV SOLN
18.0000 mL | Freq: Once | INTRAVENOUS | Status: AC | PRN
  Administered 2013-07-30: 18 mL via INTRAVENOUS

## 2013-08-01 ENCOUNTER — Telehealth: Payer: Self-pay | Admitting: Diagnostic Neuroimaging

## 2013-08-01 NOTE — Telephone Encounter (Signed)
Called and spoke to patient and told her we would mail her a release of information and we would be glad to send her records for her. Patient understood and she will be waiting on release.

## 2013-08-01 NOTE — Telephone Encounter (Signed)
Pt called in and stated that her PCP - Dr. Manuella Ghazi and her ophthalmologist Dr. Nehemiah Massed would like to have copies of the office notes from her visit as well and information from the MRI that was completed on 07-30-13. Please call patient with any questions.  Thank you

## 2013-09-27 ENCOUNTER — Ambulatory Visit: Payer: Medicaid Other | Admitting: Diagnostic Neuroimaging

## 2013-10-03 ENCOUNTER — Telehealth: Payer: Self-pay | Admitting: Diagnostic Neuroimaging

## 2013-10-03 NOTE — Telephone Encounter (Signed)
Spoke to patient regarding rescheduling 10/17/13 appointment per Dr. Gladstone Lighter schedule, patient verbalized understanding.

## 2013-10-17 ENCOUNTER — Ambulatory Visit: Payer: Medicaid Other | Admitting: Diagnostic Neuroimaging

## 2013-11-07 ENCOUNTER — Other Ambulatory Visit: Payer: Self-pay | Admitting: Endocrinology

## 2013-12-05 ENCOUNTER — Encounter: Payer: Self-pay | Admitting: Diagnostic Neuroimaging

## 2013-12-05 ENCOUNTER — Ambulatory Visit (INDEPENDENT_AMBULATORY_CARE_PROVIDER_SITE_OTHER): Payer: Medicaid Other | Admitting: Diagnostic Neuroimaging

## 2013-12-05 VITALS — BP 118/84 | HR 76 | Ht 68.5 in | Wt 189.6 lb

## 2013-12-05 DIAGNOSIS — G729 Myopathy, unspecified: Secondary | ICD-10-CM

## 2013-12-05 DIAGNOSIS — M6281 Muscle weakness (generalized): Secondary | ICD-10-CM

## 2013-12-05 DIAGNOSIS — H02402 Unspecified ptosis of left eyelid: Secondary | ICD-10-CM

## 2013-12-05 DIAGNOSIS — H02409 Unspecified ptosis of unspecified eyelid: Secondary | ICD-10-CM

## 2013-12-05 NOTE — Patient Instructions (Signed)
I will check EMG/NCS testing.

## 2013-12-05 NOTE — Progress Notes (Signed)
[facility]  PATIENT: Amanda Meyers DOB: February 28, 1956  REFERRING CLINICIAN: Manuella Ghazi  HISTORY FROM: patient  REASON FOR VISIT: follow up   HISTORICAL  CHIEF COMPLAINT:  Chief Complaint  Patient presents with  . Follow-up    Ptosis    HISTORY OF PRESENT ILLNESS:   UPDATE 12/05/13: Since last visit, left ptosis stable. Also with persistent arm and leg weakness. Mentions that she has a "slipped lens" in the left eye.   PRIOR HPI (07/19/13): 58 year old right-handed female here for evaluation of left facial asymmetry. 2 months ago patient noticed gradual onset of decreased vision in her left eye. Patient went to her eye doctor who noticed left eye drooping and left facial asymmetry, and was concerned for stroke or Bell's palsy as an etiology. Patient was referred to me for further evaluation. Since that time patient has noted also looked gradual left eyelid drooping, left. Orbital swelling, left facial drooping, decreased sensation in the left face, left arm and left leg. Patient has noted some hoarse voice as well. Past medical history of breast cancer and thyroid cancer status post surgery and radiation treatment. Patient thinks she had some "nerve damage" related to prior cancer treatment. Patient denies sudden onset symptoms on one side of her body. She notes some gradual and fluctuating symptoms ongoing.  REVIEW OF SYSTEMS: Full 14 system review of systems performed and notable only for eye pain blurred vision incont bladder numbness joint pain aching muscles.   ALLERGIES: Allergies  Allergen Reactions  . Gabapentin Swelling    Numbness also c/o.  Swelling occurred legs.    HOME MEDICATIONS: Outpatient Prescriptions Prior to Visit  Medication Sig Dispense Refill  . anastrozole (ARIMIDEX) 1 MG tablet Take 1 mg by mouth daily.        Marland Kitchen aspirin 81 MG EC tablet Take 81 mg by mouth daily.        Marland Kitchen atorvastatin (LIPITOR) 20 MG tablet Take 20 mg by mouth daily.         . cycloSPORINE (RESTASIS) 0.05 % ophthalmic emulsion Place 1 drop into both eyes 2 (two) times daily.        Marland Kitchen doxycycline (VIBRA-TABS) 100 MG tablet Take 100 mg by mouth daily.        Marland Kitchen levothyroxine (SYNTHROID, LEVOTHROID) 100 MCG tablet TAKE 1 TABLET BY MOUTH EVERY DAY BEFORE BREAKFAST  30 tablet  1  . metoprolol (TOPROL-XL) 100 MG 24 hr tablet Take 100 mg by mouth daily.        Marland Kitchen omeprazole (PRILOSEC) 20 MG capsule Take 20 mg by mouth daily.        . phentermine (ADIPEX-P) 37.5 MG tablet Take 37.5 mg by mouth every morning before breakfast.        . solifenacin (VESICARE) 10 MG tablet Take 5 mg by mouth daily.         No facility-administered medications prior to visit.    PAST MEDICAL HISTORY: Past Medical History  Diagnosis Date  . Cancer     Breast  . History of DVT (deep vein thrombosis)   . Hypertension   . Lung disease   . Dyslipidemia   . Urolithiasis   . Urinary incontinence   . Hypothyroidism   . Surgical hypoparathyroidism   . History of papillary adenocarcinoma of thyroid     Stage 1  . S/P thyroidectomy 11/2005    Suspicious nodules, path- 2 foci left lobe (10 &62m) I-131 Rx 02/2006, neg thyrogen scan 10/2006, thyroglobulin  0.7 neg antibody 11/2006 07/2007 01/2008 07/2008 07/2009    PAST SURGICAL HISTORY: Past Surgical History  Procedure Laterality Date  . Thyroidectomy  2007  . Mastectomy Left 2002    FAMILY HISTORY: Family History  Problem Relation Age of Onset  . Stroke Father   . Stroke Maternal Grandmother   . Stroke Maternal Grandfather   . Stroke Paternal Grandmother   . Stroke Paternal Grandfather     SOCIAL HISTORY:  History   Social History  . Marital Status: Single    Spouse Name: N/A    Number of Children: 0  . Years of Education: HS   Occupational History  .     Social History Main Topics  . Smoking status: Never Smoker   . Smokeless tobacco: Never Used  . Alcohol Use: No  . Drug Use: No  . Sexual Activity: Not on  file   Other Topics Concern  . Not on file   Social History Narrative   Lives at [place], [place])   Patient lives at home with her mother 07/19/13   Caffeine Use: 3-4 cups daily     PHYSICAL EXAM  Filed Vitals:   12/05/13 1504  BP: 118/84  Pulse: 76  Height: 5' 8.5" (1.74 m)  Weight: 189 lb 9.6 oz (86.002 kg)    Not recorded    Body mass index is 28.41 kg/(m^2).  GENERAL EXAM: Patient is in no distress; well developed, nourished and groomed; neck is supple  CARDIOVASCULAR: Regular rate and rhythm, no murmurs, no carotid bruits  NEUROLOGIC: MENTAL STATUS: awake, alert, language fluent, comprehension intact, naming intact, fund of knowledge appropriate CRANIAL NERVE: pupils equal and reactive to light, visual fields full to confrontation, LEFT PTOSIS. No nystagmus. DECR SENS IN LEFT V2, V3. LOWER FACE SYMM.  Hearing intact, palate elevates symmetrically, uvula midline, shoulder shrug symmetric, tongue midline. NASAL VOICE, SLIGHTLY HOARSE. MOTOR: normal bulk and tone, BUE (D 3+, B 3, T 3, GRIP 4). BLE (HF 3, KE/KF 4, DF 3).  SENSORY: normal and symmetric to light touch COORDINATION: finger-nose-finger, fine finger movements normal REFLEXES: BUE 1, BLE 0 GAIT/STATION: SLOW TO RISE, STOOPED POSTURE, SLOW UNSTEADY GAIT. SHUFFLING STEPS.     DIAGNOSTIC DATA (LABS, IMAGING, TESTING) - I reviewed patient records, labs, notes, testing and imaging myself where available.  Lab Results  Component Value Date   WBC 5.7 02/05/2010   HGB 12.0 02/05/2010   HCT 37.4 02/05/2010   MCV 96.6 02/05/2010   PLT 283 02/05/2010      Component Value Date/Time   NA 139 02/05/2010 1312   K 4.7 02/05/2010 1312   CL 103 02/05/2010 1312   CO2 29 02/05/2010 1312   GLUCOSE 92 02/05/2010 1312   BUN 18 02/05/2010 1312   CREATININE 1.17 02/05/2010 1312   CALCIUM 9.3 01/18/2013 1428   CALCIUM 9.5 01/19/2012 1647   PROT 6.0 11/25/2009 0330   ALBUMIN 2.4* 11/28/2009 0500   AST 21 11/25/2009  0330   ALT 17 11/25/2009 0330   ALKPHOS 66 11/25/2009 0330   BILITOT 0.9 11/25/2009 0330   GFRNONAA 48* 02/05/2010 1312   GFRAA  Value: 58        The eGFR has been calculated using the MDRD equation. This calculation has not been validated in all clinical situations. eGFR's persistently <60 mL/min signify possible Chronic Kidney Disease.* 02/05/2010 1312   No results found for this basename: CHOL,  HDL,  LDLCALC,  LDLDIRECT,  TRIG,  CHOLHDL   Lab  Results  Component Value Date   HGBA1C 5.4 07/19/2013   Lab Results  Component Value Date   VITAMINB12 413 07/19/2013   Lab Results  Component Value Date   TSH 0.77 01/18/2013   07/19/13 Labs: Elevated CK: 249 (h) Normal: Aldolase 4.4, ESR 2, CRP 1, AchR < 0.03, A1c 5.4, B12 413  07/30/13 MRI brain - mild changes of chronic microvascular ischemia and supratentorial cortical atrophy; orbits unremarkable. I reviewed images myself and agree with interpretation. -VRP   ASSESSMENT AND PLAN  58 y.o. year old female here with gradual onset decreased visual acuity in the left eye since early 2015. ALso with progressive upper and lower ext weakness, left eyelid ptosis. Exam also notable for erythema over the bilateral eyelids. CK slightly elevated (249).   Ddx: myopathy (polymyositis/dermatomyositis); less likely polyneuropathy or myasthenia gravis  PLAN: - EMG/NCS - then labs and possible muscle biopsy  Orders Placed This Encounter  Procedures  . NCV with EMG(electromyography)   Return for for NCV/EMG.    Penni Bombard, MD 01/04/2582, 4:62 PM Certified in Neurology, Neurophysiology and Neuroimaging  [facility] [address] [place], [place] [address]  [phone]

## 2013-12-13 ENCOUNTER — Other Ambulatory Visit: Payer: Self-pay

## 2013-12-13 ENCOUNTER — Ambulatory Visit
Admission: RE | Admit: 2013-12-13 | Discharge: 2013-12-13 | Disposition: A | Discharge status: Home / Self Care | Payer: Medicaid Other | Source: Ambulatory Visit

## 2013-12-13 DIAGNOSIS — Z9012 Acquired absence of left breast and nipple: Secondary | ICD-10-CM

## 2013-12-13 DIAGNOSIS — Z853 Personal history of malignant neoplasm of breast: Secondary | ICD-10-CM

## 2013-12-13 DIAGNOSIS — Z1231 Encounter for screening mammogram for malignant neoplasm of breast: Secondary | ICD-10-CM

## 2013-12-25 ENCOUNTER — Encounter (INDEPENDENT_AMBULATORY_CARE_PROVIDER_SITE_OTHER): Payer: Self-pay | Admitting: Radiology

## 2013-12-25 ENCOUNTER — Ambulatory Visit (INDEPENDENT_AMBULATORY_CARE_PROVIDER_SITE_OTHER): Payer: Medicaid Other | Admitting: Diagnostic Neuroimaging

## 2013-12-25 DIAGNOSIS — G729 Myopathy, unspecified: Secondary | ICD-10-CM

## 2013-12-25 DIAGNOSIS — Z0289 Encounter for other administrative examinations: Secondary | ICD-10-CM

## 2013-12-25 DIAGNOSIS — H02402 Unspecified ptosis of left eyelid: Secondary | ICD-10-CM

## 2013-12-25 DIAGNOSIS — M6281 Muscle weakness (generalized): Secondary | ICD-10-CM

## 2013-12-25 NOTE — Procedures (Signed)
   [facility]  NCS (NERVE CONDUCTION STUDY) WITH EMG (ELECTROMYOGRAPHY) REPORT   STUDY DATE: 12/25/13 PATIENT NAME: Amanda Meyers DOB: 1956-01-26 MRN: [id number]  ORDERING CLINICIAN: Andrey Spearman, MD   TECHNOLOGIST: Towana Badger  ELECTROMYOGRAPHER: Earlean Polka. Kisean Rollo, MD  CLINICAL INFORMATION: 58 year old female with left ptosis, upper and lower extremity proximal weakness and elevated CK. Also with history of chemotherapy neuropathy. Evaluate for myopathy.  FINDINGS: NERVE CONDUCTION STUDY: Bilateral median and left ulnar motor responses in the latencies are normal. Right ulnar motor response has borderline prolonged distal latency, normal amplitude, normal conduction velocity normal and latency. Left peroneal motor response could not be obtained. Right peroneal motor response has prolonged distal latency, decreased amplitude, slow conduction velocities (30 m/s) and normal F-wave latency. Right tibial motor response has decreased amplitude, normal conduction velocity and normal F-wave latency. Left peroneal motor response has prolonged distal latency, decreased appetite, slow conduction velocity (30 m/s) and absent F wave latency.  Bilateral median and left ulnar sensory responses are normal. Right ulnar sensory response is normal amplitude and slow conduction velocity 45 m/s.  Bilateral sural sensory responses could not be obtained.  NEEDLE ELECTROMYOGRAPHY:  Examination of right upper extremity deltoid, biceps, triceps, flexor carpi radialis, first dorsal interosseous is normal. Right lower extremity vastus medialis, tibialis anterior, gastrocnemius shows no abnormal spontaneous activity and rest and decreased recruitment of large motor units in right tibialis anterior on exertion.   IMPRESSION:  Abnormal study demonstrating: 1. 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 2. No evidence of myopathic abnormalities on needle  EMG.    INTERPRETING PHYSICIAN:  Penni Bombard, MD Certified in Neurology, Neurophysiology and Neuroimaging  [facility] [address] [place], [place] [address] [phone]

## 2013-12-26 LAB — ANA W/REFLEX IF POSITIVE: ANA: NEGATIVE

## 2013-12-26 LAB — PAN-ANCA
ANCA Proteinase 3: <3.5 U/mL (ref 0.0–3.5)
Atypical pANCA: <1:20 {titer}
P-ANCA: <1:20 {titer}

## 2013-12-26 LAB — ALT: ALT: 10 IU/L (ref 0–32)

## 2013-12-26 LAB — LACTATE DEHYDROGENASE: LDH: 213 IU/L (ref 119–226)

## 2013-12-26 LAB — CK: Total CK: 89 U/L (ref 24–173)

## 2013-12-26 LAB — AST: AST: 20 IU/L (ref 0–40)

## 2013-12-26 LAB — ANTI-JO 1 ANTIBODY, IGG

## 2013-12-26 LAB — ALDOLASE: Aldolase: 4.5 U/L (ref 3.3–10.3)

## 2013-12-27 ENCOUNTER — Telehealth: Payer: Self-pay | Admitting: Diagnostic Neuroimaging

## 2013-12-27 NOTE — Telephone Encounter (Signed)
Patient calling to speak with Lovey Newcomer regarding her test at [facility], please return call and advise.

## 2013-12-27 NOTE — Telephone Encounter (Signed)
Pt returned call.  She spoke to [employer] and she gave them information concerning her insurance/costs and she will have to pt $250.00 (that is if qualifies for financial assistance).  Will proceed with filling out form and faxing to them at [facility] lab.  She verbalized understanding.

## 2014-01-06 ENCOUNTER — Telehealth: Payer: Self-pay | Admitting: *Deleted

## 2014-01-06 ENCOUNTER — Other Ambulatory Visit: Payer: Self-pay | Admitting: Endocrinology

## 2014-01-06 NOTE — Telephone Encounter (Signed)
I called and spoke to pt and let her know I was faxing to [employer] the forms for her labs. [phone] ,  [phone]  They will call her once received.  She verbalized understanding.

## 2014-01-16 ENCOUNTER — Telehealth: Payer: Self-pay | Admitting: Diagnostic Neuroimaging

## 2014-01-16 NOTE — Telephone Encounter (Signed)
Patient wanted to make Lovey Newcomer, RN aware that she mailed form along with payment back to [facility].  FYI

## 2014-01-17 ENCOUNTER — Ambulatory Visit (INDEPENDENT_AMBULATORY_CARE_PROVIDER_SITE_OTHER): Payer: Medicaid Other | Admitting: Endocrinology

## 2014-01-17 ENCOUNTER — Encounter: Payer: Self-pay | Admitting: Endocrinology

## 2014-01-17 VITALS — BP 132/88 | HR 70 | Temp 98.2°F | Ht 68.5 in | Wt 189.0 lb

## 2014-01-17 DIAGNOSIS — E890 Postprocedural hypothyroidism: Secondary | ICD-10-CM

## 2014-01-17 DIAGNOSIS — C73 Malignant neoplasm of thyroid gland: Secondary | ICD-10-CM

## 2014-01-17 DIAGNOSIS — Z23 Encounter for immunization: Secondary | ICD-10-CM

## 2014-01-17 DIAGNOSIS — E209 Hypoparathyroidism, unspecified: Secondary | ICD-10-CM

## 2014-01-17 LAB — TSH: TSH: 0.32 u[IU]/mL — AB (ref 0.35–4.50)

## 2014-01-17 NOTE — Patient Instructions (Signed)
blood tests are being requested for you today.  We'll contact you with results.  It is getting more unlikely that we'll ever hear from your thyroid cancer again.    Please return in 1 year.

## 2014-01-17 NOTE — Progress Notes (Signed)
Subjective:    Patient ID: Amanda Meyers, female    DOB: 08-14-1955, 58 y.o.   MRN: [id number]  HPI The state of at least three ongoing medical problems is addressed today: stage 1 papillary adenocarcinoma thyroid.  Pt says she does not notice any nodule at the anterior neck.      8/07    thyroidect for suspicious nodules      8/07    pathol: 2 foci left lobe (10 and 8 mm).       11/07  131-i rx [facility]       7/08    neg thyrogen scan      8/08    thyroglobulin=0.7 (antibody neg)      4/09    tg and anti-tg antibody are neg     10/09   tg and anti-tg antibody are neg      4/10    tg and anti-tg antibody are neg      4/11    tg and anti-tg antibody are neg      9/12    tg and anti-tg antibody are neg      9/14    tg and anti-tg antibody are neg Postsurgical hypothyroidism: she has lost a few lbs.   Postsurgical hypoparathyroidism: Denies muscle cramps.   She takes a Ca++ supplement, 1-BID.   Past Medical History  Diagnosis Date  . Cancer     Breast  . History of DVT (deep vein thrombosis)   . Hypertension   . Lung disease   . Dyslipidemia   . Urolithiasis   . Urinary incontinence   . Hypothyroidism   . Surgical hypoparathyroidism   . History of papillary adenocarcinoma of thyroid     Stage 1  . S/P thyroidectomy 11/2005    Suspicious nodules, path- 2 foci left lobe (10 &20mm) I-131 Rx 02/2006, neg thyrogen scan 10/2006, thyroglobulin  0.7 neg antibody 11/2006 07/2007 01/2008 07/2008 07/2009    Past Surgical History  Procedure Laterality Date  . Thyroidectomy  2007  . Mastectomy Left 2002    History   Social History  . Marital Status: Single    Spouse Name: N/A    Number of Children: 0  . Years of Education: HS   Occupational History  .     Social History Main Topics  . Smoking status: Never Smoker   . Smokeless tobacco: Never Used  . Alcohol Use: No  . Drug Use: No  . Sexual Activity: Not on file   Other Topics Concern  . Not on file   Social  History Narrative   Lives at [place], [place])   Patient lives at home with her mother 07/19/13   Caffeine Use: 3-4 cups daily    Current Outpatient Prescriptions on File Prior to Visit  Medication Sig Dispense Refill  . anastrozole (ARIMIDEX) 1 MG tablet Take 1 mg by mouth daily.        Marland Kitchen aspirin 81 MG EC tablet Take 81 mg by mouth daily.        Marland Kitchen atorvastatin (LIPITOR) 20 MG tablet Take 20 mg by mouth daily.        . cycloSPORINE (RESTASIS) 0.05 % ophthalmic emulsion Place 1 drop into both eyes 2 (two) times daily.        Marland Kitchen doxycycline (VIBRA-TABS) 100 MG tablet Take 100 mg by mouth daily.        . metoprolol (TOPROL-XL) 100 MG 24 hr tablet Take 100  mg by mouth daily.        Marland Kitchen omeprazole (PRILOSEC) 20 MG capsule Take 20 mg by mouth daily.        . phentermine (ADIPEX-P) 37.5 MG tablet Take 37.5 mg by mouth every morning before breakfast.        . solifenacin (VESICARE) 10 MG tablet Take 5 mg by mouth daily.         No current facility-administered medications on file prior to visit.    Allergies  Allergen Reactions  . Gabapentin Swelling    Numbness also c/o.  Swelling occurred legs.    Family History  Problem Relation Age of Onset  . Stroke Father   . Stroke Maternal Grandmother   . Stroke Maternal Grandfather   . Stroke Paternal Grandmother   . Stroke Paternal Grandfather     BP 132/88  Pulse 70  Temp(Src) 98.2 F (36.8 C) (Oral)  Ht 5' 8.5" (1.74 m)  Wt 189 lb (85.73 kg)  BMI 28.32 kg/m2  SpO2 97%   Review of Systems Denies numbness.  She has insomnia.      Objective:   Physical Exam VITAL SIGNS:  See vs page GENERAL: no distress Neck: a healed scar is present.  i do not appreciate a nodule in the thyroid or elsewhere in the neck.     Lab Results  Component Value Date   TSH 0.32* 01/17/2014      Assessment & Plan:  Differentiated thyroid cancer: no clinical evidence of recurrence. Postsurgical hypothyroidism: given the low stage and long  disease-free interval, our goal is a normal TSH Postsurgical hypoparathyroidism: on rx.   Patient is advised the following: Patient Instructions  blood tests are being requested for you today.  We'll contact you with results.  It is getting more unlikely that we'll ever hear from your thyroid cancer again.    Please return in 1 year.

## 2014-01-18 MED ORDER — LEVOTHYROXINE SODIUM 88 MCG PO TABS: 88.0000 ug | ORAL_TABLET | Freq: Every day | ORAL | Status: DC

## 2014-01-20 NOTE — Addendum Note (Signed)
Addended by: Moody Bruins E on: 01/20/2014 09:51 AM   Modules accepted: Orders

## 2014-01-21 LAB — THYROGLOBULIN ANTIBODY

## 2014-01-21 LAB — PTH, INTACT AND CALCIUM
Calcium: 8.7 mg/dL (ref 8.4–10.5)
PTH: 40 pg/mL (ref 14–64)

## 2014-01-21 LAB — THYROGLOBULIN LEVEL: Thyroglobulin: 0.2 ng/mL — ABNORMAL LOW (ref 2.8–40.9)

## 2014-01-21 NOTE — Telephone Encounter (Signed)
Noted  

## 2014-04-07 ENCOUNTER — Encounter: Payer: Self-pay | Admitting: Diagnostic Neuroimaging

## 2014-06-04 ENCOUNTER — Other Ambulatory Visit: Payer: Self-pay | Admitting: Nephrology

## 2014-06-04 DIAGNOSIS — N183 Chronic kidney disease, stage 3 unspecified: Secondary | ICD-10-CM

## 2014-06-09 ENCOUNTER — Other Ambulatory Visit: Payer: Medicaid Other

## 2014-06-12 ENCOUNTER — Ambulatory Visit
Admission: RE | Admit: 2014-06-12 | Discharge: 2014-06-12 | Disposition: A | Discharge status: Home / Self Care | Payer: Medicaid Other | Source: Ambulatory Visit | Attending: Nephrology | Admitting: Nephrology

## 2014-06-12 DIAGNOSIS — N183 Chronic kidney disease, stage 3 unspecified: Secondary | ICD-10-CM

## 2014-11-12 ENCOUNTER — Other Ambulatory Visit: Payer: Self-pay

## 2014-11-12 DIAGNOSIS — Z1231 Encounter for screening mammogram for malignant neoplasm of breast: Secondary | ICD-10-CM

## 2014-12-17 ENCOUNTER — Ambulatory Visit
Admission: RE | Admit: 2014-12-17 | Discharge: 2014-12-17 | Disposition: A | Discharge status: Home / Self Care | Payer: Medicaid Other | Source: Ambulatory Visit

## 2014-12-17 DIAGNOSIS — Z1231 Encounter for screening mammogram for malignant neoplasm of breast: Secondary | ICD-10-CM

## 2015-01-01 ENCOUNTER — Other Ambulatory Visit: Payer: Self-pay | Admitting: Endocrinology

## 2015-01-19 ENCOUNTER — Encounter: Payer: Self-pay | Admitting: Endocrinology

## 2015-01-19 ENCOUNTER — Ambulatory Visit (INDEPENDENT_AMBULATORY_CARE_PROVIDER_SITE_OTHER): Payer: Medicaid Other | Admitting: Endocrinology

## 2015-01-19 ENCOUNTER — Ambulatory Visit: Payer: Medicaid Other | Admitting: Endocrinology

## 2015-01-19 VITALS — BP 132/88 | HR 56 | Temp 97.9°F | Ht 68.5 in | Wt 188.0 lb

## 2015-01-19 DIAGNOSIS — C73 Malignant neoplasm of thyroid gland: Secondary | ICD-10-CM | POA: Diagnosis not present

## 2015-01-19 DIAGNOSIS — E209 Hypoparathyroidism, unspecified: Secondary | ICD-10-CM

## 2015-01-19 DIAGNOSIS — Z23 Encounter for immunization: Secondary | ICD-10-CM

## 2015-01-19 DIAGNOSIS — E890 Postprocedural hypothyroidism: Secondary | ICD-10-CM | POA: Diagnosis not present

## 2015-01-19 DIAGNOSIS — M810 Age-related osteoporosis without current pathological fracture: Secondary | ICD-10-CM | POA: Diagnosis not present

## 2015-01-19 LAB — TSH: TSH: 8.22 u[IU]/mL — ABNORMAL HIGH (ref 0.35–4.50)

## 2015-01-19 LAB — VITAMIN D 25 HYDROXY (VIT D DEFICIENCY, FRACTURES): VITD: 76.05 ng/mL (ref 30.00–100.00)

## 2015-01-19 NOTE — Progress Notes (Signed)
Subjective:    Patient ID: Amanda Meyers, female    DOB: [birth year], 58 y.o.   MRN: [id number]  HPI The state of at least three ongoing medical problems is addressed today: stage 1 papillary adenocarcinoma thyroid.  Pt says she does not notice any nodule at the anterior neck.      8/07    thyroidect for suspicious nodules      8/07    pathol: 2 foci left lobe (10 and 8 mm).       11/07  131-i rx [facility] .       7/08    neg thyrogen scan.      8/08    thyroglobulin=0.7 (antibody neg)      4/09    tg and anti-tg antibody are neg.      10/09   tg and anti-tg antibody are neg.       4/10    tg and anti-tg antibody are neg.       4/11    tg and anti-tg antibody are neg      9/12    tg and anti-tg antibody are neg      9/14    tg and anti-tg antibody are neg      9/15    tg and anti-tg antibody are neg Postsurgical hypothyroidism: she has lost a few lbs.   Postsurgical hypoparathyroidism: she has intermittent muscle cramps.   She takes a Ca++ supplement. Osteoporosis: she has never been on rx for this. Past Medical History  Diagnosis Date  . Cancer     Breast  . History of DVT (deep vein thrombosis)   . Hypertension   . Lung disease   . Dyslipidemia   . Urolithiasis   . Urinary incontinence   . Hypothyroidism   . Surgical hypoparathyroidism   . History of papillary adenocarcinoma of thyroid     Stage 1  . S/P thyroidectomy 11/2005    Suspicious nodules, path- 2 foci left lobe (10 &52mm) I-131 Rx 02/2006, neg thyrogen scan 10/2006, thyroglobulin  0.7 neg antibody 11/2006 07/2007 01/2008 07/2008 07/2009    Past Surgical History  Procedure Laterality Date  . Thyroidectomy  2007  . Mastectomy Left 2002    Social History   Social History  . Marital Status: Single    Spouse Name: N/A  . Number of Children: 0  . Years of Education: HS   Occupational History  .     Social History Main Topics  . Smoking status: Never Smoker   . Smokeless tobacco: Never Used  .  Alcohol Use: No  . Drug Use: No  . Sexual Activity: Not on file   Other Topics Concern  . Not on file   Social History Narrative   Lives at [place], [place])   Patient lives at home with her mother 07/19/13   Caffeine Use: 3-4 cups daily    Current Outpatient Prescriptions on File Prior to Visit  Medication Sig Dispense Refill  . anastrozole (ARIMIDEX) 1 MG tablet Take 1 mg by mouth daily.      Marland Kitchen aspirin 81 MG EC tablet Take 81 mg by mouth daily.      Marland Kitchen atorvastatin (LIPITOR) 20 MG tablet Take 20 mg by mouth daily.      . cycloSPORINE (RESTASIS) 0.05 % ophthalmic emulsion Place 1 drop into both eyes 2 (two) times daily.      Marland Kitchen doxycycline (VIBRA-TABS) 100 MG tablet Take 100 mg by  mouth daily.      . metoprolol (TOPROL-XL) 100 MG 24 hr tablet Take 100 mg by mouth daily.      Marland Kitchen omeprazole (PRILOSEC) 20 MG capsule Take 20 mg by mouth daily.      . phentermine (ADIPEX-P) 37.5 MG tablet Take 37.5 mg by mouth every morning before breakfast.      . solifenacin (VESICARE) 10 MG tablet Take 5 mg by mouth daily.       No current facility-administered medications on file prior to visit.    Allergies  Allergen Reactions  . Gabapentin Swelling    Numbness also c/o.  Swelling occurred legs.    Family History  Problem Relation Age of Onset  . Stroke Father   . Stroke Maternal Grandmother   . Stroke Maternal Grandfather   . Stroke Paternal Grandmother   . Stroke Paternal Grandfather     BP 132/88 mmHg  Pulse 56  Temp(Src) 97.9 F (36.6 C) (Oral)  Ht 5' 8.5" (1.74 m)  Wt 188 lb (85.276 kg)  BMI 28.17 kg/m2  SpO2 97%  Review of Systems She has slight numbness of the feet (she attributes to chemotx).  Denies falls.      Objective:   Physical Exam VITAL SIGNS:  See vs page GENERAL: no distress Neck: a healed scar is present.  i do not appreciate a nodule in the thyroid or elsewhere in the neck   Lab Results  Component Value Date   TSH 8.22* 01/19/2015   TG=0.1 PTH=5 Ca++=9.2    Assessment & Plan:  Postsurgical hypothyroidism: she needs increased rx. Osteoporosis: she is due for recheck. Thyroid cancer: no evidence of recurrence.   postsurgical hypoparathyroidism, stable  Patient is advised the following: Patient Instructions  blood tests are being requested for you today.  We'll contact you with results.  It is getting more unlikely that we'll ever hear from your thyroid cancer again.   Let's recheck the bone density test.    Please return in 1 year.

## 2015-01-19 NOTE — Patient Instructions (Signed)
blood tests are being requested for you today.  We'll contact you with results.  It is getting more unlikely that we'll ever hear from your thyroid cancer again.   Let's recheck the bone density test.    Please return in 1 year.

## 2015-01-20 LAB — PTH, INTACT AND CALCIUM
Calcium: 9.2 mg/dL (ref 8.4–10.5)
PTH: 5 pg/mL — AB (ref 14–64)

## 2015-01-20 LAB — THYROGLOBULIN ANTIBODY: Thyroglobulin Ab: <1 IU/mL (ref <2)

## 2015-01-20 LAB — THYROGLOBULIN LEVEL: Thyroglobulin: 0.1 ng/mL — ABNORMAL LOW (ref 2.8–40.9)

## 2015-01-20 MED ORDER — LEVOTHYROXINE SODIUM 100 MCG PO TABS: 100.0000 ug | ORAL_TABLET | Freq: Every day | ORAL | Status: DC

## 2015-02-02 ENCOUNTER — Other Ambulatory Visit: Payer: Self-pay

## 2015-02-02 ENCOUNTER — Telehealth: Payer: Self-pay | Admitting: Endocrinology

## 2015-02-02 MED ORDER — LEVOTHYROXINE SODIUM 75 MCG PO TABS: 75.0000 ug | ORAL_TABLET | Freq: Every day | ORAL | Status: DC

## 2015-02-02 NOTE — Telephone Encounter (Signed)
See note below to be advised. 

## 2015-02-02 NOTE — Telephone Encounter (Signed)
Patient called stating that she was advised by her PCP to lower her thyroid medication to 75 mcg  Please advise patient    Thank you

## 2015-02-02 NOTE — Telephone Encounter (Signed)
Thank you. i have changed med list i'll see you next time.

## 2015-02-02 NOTE — Telephone Encounter (Signed)
Pt advised of note below and voiced understanding.  

## 2015-02-05 ENCOUNTER — Telehealth: Payer: Self-pay | Admitting: Endocrinology

## 2015-02-05 NOTE — Telephone Encounter (Signed)
Pt requesting we put in a referral for a bone density and get it scheduled for her. We are not her PCP however she said Dr. Loanne Drilling recommended it

## 2015-02-05 NOTE — Telephone Encounter (Signed)
Due to pt insurance the pt was informed that she was to call her PCP for this referral. I ensured her that this information is visible via paper via her AVS and also in her EPIC office note.

## 2015-03-23 ENCOUNTER — Other Ambulatory Visit: Payer: Self-pay | Admitting: Endocrinology

## 2015-03-23 ENCOUNTER — Other Ambulatory Visit (INDEPENDENT_AMBULATORY_CARE_PROVIDER_SITE_OTHER): Payer: Medicaid Other

## 2015-03-23 ENCOUNTER — Other Ambulatory Visit: Payer: Self-pay

## 2015-03-23 DIAGNOSIS — E890 Postprocedural hypothyroidism: Secondary | ICD-10-CM | POA: Diagnosis not present

## 2015-03-23 LAB — TSH: TSH: 16.13 u[IU]/mL — AB (ref 0.35–4.50)

## 2015-03-23 MED ORDER — LEVOTHYROXINE SODIUM 88 MCG PO TABS: 88.0000 ug | ORAL_TABLET | Freq: Every day | ORAL | Status: DC

## 2015-04-01 ENCOUNTER — Other Ambulatory Visit: Payer: Self-pay | Admitting: Endocrinology

## 2015-04-02 NOTE — Telephone Encounter (Signed)
Please advise if ok to refill. Medication list states the pt is taking 88 mcg. Thanks!

## 2015-05-06 ENCOUNTER — Other Ambulatory Visit: Payer: Medicaid Other

## 2015-05-13 ENCOUNTER — Other Ambulatory Visit (INDEPENDENT_AMBULATORY_CARE_PROVIDER_SITE_OTHER): Payer: Medicaid Other

## 2015-05-13 ENCOUNTER — Other Ambulatory Visit: Payer: Self-pay | Admitting: Endocrinology

## 2015-05-13 DIAGNOSIS — E890 Postprocedural hypothyroidism: Secondary | ICD-10-CM

## 2015-05-13 LAB — TSH: TSH: 13.27 u[IU]/mL — ABNORMAL HIGH (ref 0.35–4.50)

## 2015-05-13 MED ORDER — LEVOTHYROXINE SODIUM 112 MCG PO TABS: 112.0000 ug | ORAL_TABLET | Freq: Every day | ORAL | Status: DC

## 2015-06-22 ENCOUNTER — Other Ambulatory Visit (INDEPENDENT_AMBULATORY_CARE_PROVIDER_SITE_OTHER): Payer: Medicaid Other

## 2015-06-22 DIAGNOSIS — E890 Postprocedural hypothyroidism: Secondary | ICD-10-CM | POA: Diagnosis not present

## 2015-06-22 LAB — TSH: TSH: 1.13 u[IU]/mL (ref 0.35–4.50)

## 2015-08-21 ENCOUNTER — Other Ambulatory Visit: Payer: Self-pay | Admitting: *Deleted

## 2015-08-21 DIAGNOSIS — Z01810 Encounter for preprocedural cardiovascular examination: Secondary | ICD-10-CM

## 2015-08-21 DIAGNOSIS — N185 Chronic kidney disease, stage 5: Secondary | ICD-10-CM

## 2015-09-16 ENCOUNTER — Encounter: Payer: Self-pay | Admitting: Vascular Surgery

## 2015-09-23 ENCOUNTER — Ambulatory Visit (INDEPENDENT_AMBULATORY_CARE_PROVIDER_SITE_OTHER)
Admission: RE | Admit: 2015-09-23 | Discharge: 2015-09-23 | Disposition: A | Discharge status: Home / Self Care | Payer: Medicaid Other | Source: Ambulatory Visit | Attending: Vascular Surgery | Admitting: Vascular Surgery

## 2015-09-23 ENCOUNTER — Ambulatory Visit (HOSPITAL_COMMUNITY)
Admission: RE | Admit: 2015-09-23 | Discharge: 2015-09-23 | Disposition: A | Discharge status: Home / Self Care | Payer: Medicaid Other | Source: Ambulatory Visit | Attending: Vascular Surgery | Admitting: Vascular Surgery

## 2015-09-23 ENCOUNTER — Encounter: Payer: Self-pay | Admitting: Vascular Surgery

## 2015-09-23 ENCOUNTER — Ambulatory Visit (INDEPENDENT_AMBULATORY_CARE_PROVIDER_SITE_OTHER): Payer: Medicaid Other | Admitting: Vascular Surgery

## 2015-09-23 VITALS — BP 140/88 | HR 60 | Temp 98.6°F | Resp 16 | Ht 68.5 in | Wt 209.0 lb

## 2015-09-23 DIAGNOSIS — I83893 Varicose veins of bilateral lower extremities with other complications: Secondary | ICD-10-CM

## 2015-09-23 DIAGNOSIS — E785 Hyperlipidemia, unspecified: Secondary | ICD-10-CM | POA: Diagnosis not present

## 2015-09-23 DIAGNOSIS — N185 Chronic kidney disease, stage 5: Secondary | ICD-10-CM

## 2015-09-23 DIAGNOSIS — Z01810 Encounter for preprocedural cardiovascular examination: Secondary | ICD-10-CM

## 2015-09-23 DIAGNOSIS — I120 Hypertensive chronic kidney disease with stage 5 chronic kidney disease or end stage renal disease: Secondary | ICD-10-CM | POA: Diagnosis not present

## 2015-09-23 NOTE — Progress Notes (Signed)
[facility] [place]  Patient name: Amanda Meyers MRN: [id number] DOB: 09/22/1955 Sex: female  REASON FOR CONSULT: Lower extremity swelling and varicosities.  HPI: Amanda Meyers is a 60 y.o. female, who is seen today with complaint of lower extremity swelling and varicosities. There was some confusion. She was reviewed referred by Dr. Mercy Moore with the consideration of access. She did undergo noninvasive studies regarding the candidate for upper extremity access. Further review the chart the patient's stage II renal disease and therefore would be no indication for consideration for access at this time. Patient understands. She does report increasing swelling in both lower extremities. Does have a remote history of DVT around the time of her treatment for breast cancer an ankle fracture. He is not currently on anticoagulation. She has had progressive swelling and extension the varicosities developed both lower extremities. Has notWorn compression garments.  Past Medical History  Diagnosis Date  . Cancer (HCC)     Breast  . History of DVT (deep vein thrombosis)   . Hypertension   . Lung disease   . Dyslipidemia   . Urolithiasis   . Urinary incontinence   . Hypothyroidism   . Surgical hypoparathyroidism ([place])   . History of papillary adenocarcinoma of thyroid     Stage 1  . S/P thyroidectomy 11/2005    Suspicious nodules, path- 2 foci left lobe (10 &44mm) I-131 Rx 02/2006, neg thyrogen scan 10/2006, thyroglobulin  0.7 neg antibody 11/2006 07/2007 01/2008 07/2008 07/2009    Family History  Problem Relation Age of Onset  . Stroke Father   . Stroke Maternal Grandmother   . Stroke Maternal Grandfather   . Stroke Paternal Grandmother   . Stroke Paternal Grandfather     SOCIAL HISTORY: Social History   Social History  . Marital Status: Single    Spouse Name: N/A  . Number of Children: 0  . Years of Education: HS   Occupational History  .      Social History Main Topics  . Smoking status: Never Smoker   . Smokeless tobacco: Never Used  . Alcohol Use: No  . Drug Use: No  . Sexual Activity: Not on file   Other Topics Concern  . Not on file   Social History Narrative   Lives at [place], [place])   Patient lives at home with her mother 07/19/13   Caffeine Use: 3-4 cups daily    Allergies  Allergen Reactions  . Gabapentin Swelling    Numbness also c/o.  Swelling occurred legs.    Current Outpatient Prescriptions  Medication Sig Dispense Refill  . anastrozole (ARIMIDEX) 1 MG tablet Take 1 mg by mouth daily.      Marland Kitchen aspirin 81 MG EC tablet Take 81 mg by mouth daily.      . chlorhexidine (PERIDEX) 0.12 % solution Use as directed 15 mLs in the mouth or throat 2 (two) times daily.    . cycloSPORINE (RESTASIS) 0.05 % ophthalmic emulsion Place 1 drop into both eyes 2 (two) times daily.      Marland Kitchen desmopressin (DDAVP) 0.2 MG tablet Take 0.2 mg by mouth daily.    Marland Kitchen desonide (DESOWEN) 0.05 % cream Apply topically 2 (two) times daily.    Marland Kitchen doxycycline (VIBRA-TABS) 100 MG tablet Take 100 mg by mouth daily.      . fluorometholone (FML) 0.1 % ophthalmic suspension Place 1 drop into both eyes every 4 (four) hours.    . furosemide (LASIX) 40  MG tablet Take by mouth.    . hydrochlorothiazide (HYDRODIURIL) 25 MG tablet Take 25 mg by mouth daily.    . irbesartan (AVAPRO) 300 MG tablet Take 300 mg by mouth daily.    Marland Kitchen levothyroxine (SYNTHROID, LEVOTHROID) 112 MCG tablet Take 1 tablet (112 mcg total) by mouth daily before breakfast. 30 tablet 11  . metoprolol (LOPRESSOR) 100 MG tablet Take by mouth.    . metoprolol (TOPROL-XL) 100 MG 24 hr tablet Take 100 mg by mouth daily.      . Olopatadine HCl (PAZEO OP) Apply to eye.    Marland Kitchen omeprazole (PRILOSEC) 20 MG capsule Take 20 mg by mouth daily.      . phentermine (ADIPEX-P) 37.5 MG tablet Take 37.5 mg by mouth every morning before breakfast.      . polyethylene glycol (MIRALAX / GLYCOLAX)  packet Take 17 g by mouth daily.    . simvastatin (ZOCOR) 20 MG tablet Take 20 mg by mouth daily.    . solifenacin (VESICARE) 10 MG tablet Take 5 mg by mouth daily.      . valsartan (DIOVAN) 160 MG tablet Take 160 mg by mouth daily.    Marland Kitchen atorvastatin (LIPITOR) 20 MG tablet Take 20 mg by mouth daily. Reported on 09/23/2015    . fluticasone (FLONASE) 50 MCG/ACT nasal spray Place into both nostrils daily. Reported on 09/23/2015     No current facility-administered medications for this visit.    REVIEW OF SYSTEMS:  [X]  denotes positive finding, [ ]  denotes negative finding Cardiac  Comments:  Chest pain or chest pressure:    Shortness of breath upon exertion: x   Short of breath when lying flat: x   Irregular heart rhythm:        Vascular    Pain in calf, thigh, or hip brought on by ambulation: xx   Pain in feet at night that wakes you up from your sleep:  x   Blood clot in your veins: x   Leg swelling:  x       Pulmonary    Oxygen at home:    Productive cough:     Wheezing:         Neurologic    Sudden weakness in arms or legs:     Sudden numbness in arms or legs:     Sudden onset of difficulty speaking or slurred speech:    Temporary loss of vision in one eye:     Problems with dizziness:         Gastrointestinal    Blood in stool:     Vomited blood:         Genitourinary    Burning when urinating:  x   Blood in urine:        Psychiatric    Major depression:         Hematologic    Bleeding problems:    Problems with blood clotting too easily:        Skin    Rashes or ulcers:        Constitutional    Fever or chills:      PHYSICAL EXAM: Filed Vitals:   09/23/15 1002  BP: 140/88  Pulse: 60  Temp: 98.6 F (37 C)  TempSrc: Oral  Resp: 16  Height: 5' 8.5" (1.74 m)  Weight: 209 lb (94.802 kg)  SpO2: 96%    GENERAL: The patient is a well-nourished female, in no acute distress. The vital signs are documented above.  CARDIAC: There is a regular rate and  rhythm.  VASCULAR: Palpable radial and femoral pulses bilaterally PULMONARY: There is good air exchange bilaterally without wheezing or rales. ABDOMEN: Soft and non-tender with normal pitched bowel sounds.  MUSCULOSKELETAL: There are no major deformities or cyanosis. NEUROLOGIC: No focal weakness or paresthesias are detected. SKIN: There are no ulcers or rashes noted. PSYCHIATRIC: The patient has a normal affect. Does have swelling of both lower extremities and marked superficial telangiectasia and varicosities bilaterally.  DATA:  Upper extremity vein mapping and arterial study were reviewed the patient which were normal but no current indication for access   MEDICAL ISSUES: Does have difficulty and discomfort associated with her ongoing swelling. Recommended thigh-high compression garments elevation and ibuprofen for discomfort. We'll see her in 3 months with formal venous duplex determine if this is being successful in the improving her discomfort.   Curt Jews [facility] [phone]

## 2015-11-06 ENCOUNTER — Other Ambulatory Visit: Payer: Self-pay | Admitting: Internal Medicine

## 2015-11-06 DIAGNOSIS — Z1231 Encounter for screening mammogram for malignant neoplasm of breast: Secondary | ICD-10-CM

## 2015-11-26 ENCOUNTER — Encounter: Payer: Self-pay | Admitting: Vascular Surgery

## 2015-11-30 ENCOUNTER — Other Ambulatory Visit: Payer: Self-pay | Admitting: *Deleted

## 2015-11-30 DIAGNOSIS — I83893 Varicose veins of bilateral lower extremities with other complications: Secondary | ICD-10-CM

## 2015-12-01 ENCOUNTER — Ambulatory Visit (INDEPENDENT_AMBULATORY_CARE_PROVIDER_SITE_OTHER): Payer: Medicaid Other | Admitting: Vascular Surgery

## 2015-12-01 ENCOUNTER — Other Ambulatory Visit: Payer: Self-pay | Admitting: Vascular Surgery

## 2015-12-01 ENCOUNTER — Ambulatory Visit (HOSPITAL_COMMUNITY)
Admission: RE | Admit: 2015-12-01 | Discharge: 2015-12-01 | Disposition: A | Discharge status: Home / Self Care | Payer: Medicaid Other | Source: Ambulatory Visit | Attending: Vascular Surgery | Admitting: Vascular Surgery

## 2015-12-01 ENCOUNTER — Encounter: Payer: Self-pay | Admitting: Vascular Surgery

## 2015-12-01 ENCOUNTER — Other Ambulatory Visit: Payer: Self-pay | Admitting: *Deleted

## 2015-12-01 VITALS — BP 106/74 | HR 55 | Temp 97.0°F | Resp 18 | Ht 68.0 in | Wt 199.7 lb

## 2015-12-01 DIAGNOSIS — I82401 Acute embolism and thrombosis of unspecified deep veins of right lower extremity: Secondary | ICD-10-CM | POA: Diagnosis not present

## 2015-12-01 DIAGNOSIS — I82419 Acute embolism and thrombosis of unspecified femoral vein: Secondary | ICD-10-CM | POA: Diagnosis not present

## 2015-12-01 DIAGNOSIS — I82439 Acute embolism and thrombosis of unspecified popliteal vein: Secondary | ICD-10-CM | POA: Insufficient documentation

## 2015-12-01 DIAGNOSIS — R609 Edema, unspecified: Secondary | ICD-10-CM | POA: Diagnosis present

## 2015-12-01 DIAGNOSIS — I83893 Varicose veins of bilateral lower extremities with other complications: Secondary | ICD-10-CM

## 2015-12-01 MED ORDER — RIVAROXABAN 20 MG PO TABS: 20.0000 mg | ORAL_TABLET | Freq: Every day | ORAL | 3 refills | Status: DC

## 2015-12-01 MED ORDER — RIVAROXABAN 15 MG PO TABS: 15.0000 mg | ORAL_TABLET | Freq: Two times a day (BID) | ORAL | 0 refills | Status: DC

## 2015-12-01 NOTE — Progress Notes (Signed)
Patient name: Amanda Meyers MRN: [id number] DOB: 01/30/1956 Sex: female  REASON FOR VISIT: Follow-up of lower from the venous pathology  HPI: Amanda Meyers is a 60 y.o. female in today for follow-up of venous pathology in both lower extremities. I'd seen her in May of this year at which time she did not have a formal venous duplex but this had SonoSite suggesting some potential reflux in her saphenous vein. She is here today for continued discussion and also for formal venous duplex. She denies any new symptoms. She did have prior history of left leg DVT in her femoral vein. No history of right leg DVT. She does have more swelling in her left leg and her right leg.  Current Outpatient Prescriptions  Medication Sig Dispense Refill  . anastrozole (ARIMIDEX) 1 MG tablet Take 1 mg by mouth daily.      Marland Kitchen aspirin 81 MG EC tablet Take 81 mg by mouth daily.      Marland Kitchen atorvastatin (LIPITOR) 20 MG tablet Take 20 mg by mouth daily. Reported on 09/23/2015    . cycloSPORINE (RESTASIS) 0.05 % ophthalmic emulsion Place 1 drop into both eyes 2 (two) times daily.      Marland Kitchen desmopressin (DDAVP) 0.2 MG tablet Take 0.2 mg by mouth daily.    Marland Kitchen desonide (DESOWEN) 0.05 % cream Apply topically 2 (two) times daily.    Marland Kitchen doxycycline (VIBRA-TABS) 100 MG tablet Take 100 mg by mouth daily.      . fluorometholone (FML) 0.1 % ophthalmic suspension Place 1 drop into both eyes every 4 (four) hours.    . furosemide (LASIX) 40 MG tablet Take by mouth.    . hydrochlorothiazide (HYDRODIURIL) 25 MG tablet Take 25 mg by mouth daily.    . irbesartan (AVAPRO) 300 MG tablet Take 300 mg by mouth daily.    Marland Kitchen levothyroxine (SYNTHROID, LEVOTHROID) 112 MCG tablet Take 1 tablet (112 mcg total) by mouth daily before breakfast. 30 tablet 11  . metoprolol (LOPRESSOR) 100 MG tablet Take by mouth.    . Olopatadine HCl (PAZEO OP) Apply to eye.    Marland Kitchen omeprazole (PRILOSEC) 20 MG capsule Take 20 mg by mouth daily.      . phentermine  (ADIPEX-P) 37.5 MG tablet Take 37.5 mg by mouth every morning before breakfast.      . polyethylene glycol (MIRALAX / GLYCOLAX) packet Take 17 g by mouth daily.    . simvastatin (ZOCOR) 20 MG tablet Take 20 mg by mouth daily.    . solifenacin (VESICARE) 10 MG tablet Take 5 mg by mouth daily.      . valsartan (DIOVAN) 160 MG tablet Take 160 mg by mouth daily.    . chlorhexidine (PERIDEX) 0.12 % solution Use as directed 15 mLs in the mouth or throat 2 (two) times daily.    . fluticasone (FLONASE) 50 MCG/ACT nasal spray Place into both nostrils daily. Reported on 09/23/2015    . metoprolol (TOPROL-XL) 100 MG 24 hr tablet Take 100 mg by mouth daily.       No current facility-administered medications for this visit.     REVIEW OF SYSTEMS:  [X]  denotes positive finding, [ ]  denotes negative finding Cardiac  Comments:  Chest pain or chest pressure:    Shortness of breath upon exertion:    Short of breath when lying flat:    Irregular heart rhythm:    Constitutional    Fever or chills:      PHYSICAL EXAM: Vitals:  12/01/15 1147  BP: 106/74  Pulse: (!) 55  Resp: 18  Temp: 97 F (36.1 C)  TempSrc: Oral  SpO2: 100%  Weight: 199 lb 11.2 oz (90.6 kg)  Height: 5\' 8"  (1.727 m)    GENERAL: The patient is a well-nourished female, in no acute distress. The vital signs are documented above. He does have a obesity and marked swelling in both lower extremities more so on the left leg than the right. She does have some bruising on both lower extremities. No calf tenderness noted.  Venous duplex today showed to thrombus in her right common femoral vein and femoral vein with nonocclusive thrombus in her popliteal vein. MEDICAL ISSUES: Reviewed her prior ultrasound from more at hospital in October 2016. At that time she had no clot in her right leg veins. Unclear as to when this occurred. Did explain that ultrasound is reasonably good at predicting acuteness of clot and this does appear to be acute  clot. We have written her prescription today she is leaving from our office to go to pharmacy to begin Xaralto 15 mg twice a day followed by 20 mg daily following this initial 21 day course. She will follow-up with Dr.Shah for ongoing treatment of her acute DVT. Would not recommend any further evaluation regarding her superficial venous pathology  Janique Hoefer [facility] [phone]

## 2015-12-15 ENCOUNTER — Telehealth: Payer: Self-pay | Admitting: *Deleted

## 2015-12-15 NOTE — Telephone Encounter (Signed)
Ms Jakubek called to let us know that Dr. Brigitte Pulse "was not interested in following her Xarelto" as discussed by Dr. Donnetta Hutching. I talked to Dr. Donnetta Hutching today and he will see the patient in 5 months for re-evaluation of her Xarelto. Patient was made aware and voiced agreement with this plan. Our office will send her the appt information. She will not need any vascular labs per Dr. Donnetta Hutching.

## 2015-12-18 ENCOUNTER — Ambulatory Visit
Admission: RE | Admit: 2015-12-18 | Discharge: 2015-12-18 | Disposition: A | Discharge status: Home / Self Care | Payer: Medicaid Other | Source: Ambulatory Visit | Attending: Internal Medicine | Admitting: Internal Medicine

## 2015-12-18 DIAGNOSIS — Z1231 Encounter for screening mammogram for malignant neoplasm of breast: Secondary | ICD-10-CM

## 2015-12-25 NOTE — Telephone Encounter (Signed)
I called patient and scheduled an appt for 06/15/15 at 12 noon with Sr.Early. awt

## 2016-01-19 ENCOUNTER — Encounter: Payer: Self-pay | Admitting: Endocrinology

## 2016-01-19 ENCOUNTER — Ambulatory Visit (INDEPENDENT_AMBULATORY_CARE_PROVIDER_SITE_OTHER): Payer: Medicaid Other | Admitting: Endocrinology

## 2016-01-19 VITALS — BP 110/62 | HR 75 | Ht 68.0 in | Wt 195.0 lb

## 2016-01-19 DIAGNOSIS — Z23 Encounter for immunization: Secondary | ICD-10-CM

## 2016-01-19 DIAGNOSIS — E890 Postprocedural hypothyroidism: Secondary | ICD-10-CM

## 2016-01-19 DIAGNOSIS — E208 Other hypoparathyroidism: Secondary | ICD-10-CM | POA: Diagnosis not present

## 2016-01-19 DIAGNOSIS — C73 Malignant neoplasm of thyroid gland: Secondary | ICD-10-CM

## 2016-01-19 LAB — TSH: TSH: 0.43 u[IU]/mL (ref 0.35–4.50)

## 2016-01-19 NOTE — Patient Instructions (Addendum)
blood tests are requested for you today.  We'll let you know about the results.  It is getting more unlikely that we'll ever hear from your thyroid cancer again.   It is critically important to prevent falling down (keep floor areas well-lit, dry, and free of loose objects.  If you have a cane, walker, or wheelchair, you should use it, even for short trips around the house.  Wear flat-soled shoes.  Also, try not to rush). When you have the the bone density test next month, please ask that a copy be sent here.  Here is my card.    Please return in 1 year.

## 2016-01-19 NOTE — Progress Notes (Signed)
Subjective:    Patient ID: Amanda Meyers, female    DOB: 1956-03-05, 60 y.o.   MRN: [id number]  HPI The state of at least three ongoing medical problems is addressed today: stage 1 papillary adenocarcinoma thyroid.  Pt says she does not notice any nodule at the anterior neck. 8/07    thyroidect for suspicious nodules 8/07    pathol: 2 foci left lobe (10 and 8 mm).  11/07  131-i rx [facility] .  7/08    neg thyrogen scan. 8/08    thyroglobulin=0.7 (antibody neg) 4/09    tg and anti-tg antibody are neg.  0/09   tg and anti-tg antibody are neg.  4/10    tg and anti-tg antibody are neg.  4/11    tg and anti-tg antibody are neg 9/12    tg and anti-tg antibody are neg 9/14    tg and anti-tg antibody are neg 9/15    tg and anti-tg antibody are neg 9/16 tg and anti-tg antibody are neg Postsurgical hypothyroidism: she has regained a few lbs.  Osteoporosis: she has never been on rx for this.  Denies falls.  Past Medical History:  Diagnosis Date  . Cancer (HCC)    Breast  . Dyslipidemia   . History of DVT (deep vein thrombosis)   . History of papillary adenocarcinoma of thyroid    Stage 1  . Hypertension   . Hypothyroidism   . Lung disease   . S/P thyroidectomy 11/2005   Suspicious nodules, path- 2 foci left lobe (10 &12mm) I-131 Rx 02/2006, neg thyrogen scan 10/2006, thyroglobulin  0.7 neg antibody 11/2006 07/2007 01/2008 07/2008 07/2009  . Surgical hypoparathyroidism ([place])   . Urinary incontinence   . Urolithiasis     Past Surgical History:  Procedure Laterality Date  . MASTECTOMY Left 2002  . THYROIDECTOMY  2007    Social History   Social History  . Marital status: Single    Spouse name: N/A  . Number of children: 0  . Years of education: HS   Occupational History  .  Unemployed   Social History Main Topics  . Smoking status: Never Smoker  . Smokeless tobacco: Never Used  . Alcohol use No  . Drug use: No  . Sexual activity: Not on file   Other Topics  Concern  . Not on file   Social History Narrative   Lives at [place], [place])   Patient lives at home with her mother 07/19/13   Caffeine Use: 3-4 cups daily    Current Outpatient Prescriptions on File Prior to Visit  Medication Sig Dispense Refill  . anastrozole (ARIMIDEX) 1 MG tablet Take 1 mg by mouth daily.      Marland Kitchen aspirin 81 MG EC tablet Take 81 mg by mouth daily.      Marland Kitchen atorvastatin (LIPITOR) 20 MG tablet Take 20 mg by mouth daily. Reported on 09/23/2015    . chlorhexidine (PERIDEX) 0.12 % solution Use as directed 15 mLs in the mouth or throat 2 (two) times daily.    . cycloSPORINE (RESTASIS) 0.05 % ophthalmic emulsion Place 1 drop into both eyes 2 (two) times daily.      Marland Kitchen desmopressin (DDAVP) 0.2 MG tablet Take 0.2 mg by mouth daily.    Marland Kitchen desonide (DESOWEN) 0.05 % cream Apply topically 2 (two) times daily.    Marland Kitchen doxycycline (VIBRA-TABS) 100 MG tablet Take 100 mg by mouth daily.      . fluorometholone (FML) 0.1 %  ophthalmic suspension Place 1 drop into both eyes every 4 (four) hours.    . fluticasone (FLONASE) 50 MCG/ACT nasal spray Place into both nostrils daily. Reported on 09/23/2015    . furosemide (LASIX) 40 MG tablet Take by mouth.    . hydrochlorothiazide (HYDRODIURIL) 25 MG tablet Take 25 mg by mouth daily.    . irbesartan (AVAPRO) 300 MG tablet Take 300 mg by mouth daily.    Marland Kitchen levothyroxine (SYNTHROID, LEVOTHROID) 112 MCG tablet Take 1 tablet (112 mcg total) by mouth daily before breakfast. 30 tablet 11  . metoprolol (TOPROL-XL) 100 MG 24 hr tablet Take 100 mg by mouth daily.      . Olopatadine HCl (PAZEO OP) Apply to eye.    Marland Kitchen omeprazole (PRILOSEC) 20 MG capsule Take 20 mg by mouth daily.      . phentermine (ADIPEX-P) 37.5 MG tablet Take 37.5 mg by mouth every morning before breakfast.      . polyethylene glycol (MIRALAX / GLYCOLAX) packet Take 17 g by mouth daily.    . rivaroxaban (XARELTO) 20 MG TABS tablet Take 1 tablet (20 mg total) by mouth daily with supper.  Start Xarelto 20 mg daily on Day 22 (12-22-2015). 30 tablet 3  . simvastatin (ZOCOR) 20 MG tablet Take 20 mg by mouth daily.    . solifenacin (VESICARE) 10 MG tablet Take 5 mg by mouth daily.      . valsartan (DIOVAN) 160 MG tablet Take 160 mg by mouth daily.    . Rivaroxaban (XARELTO) 15 MG TABS tablet Take 1 tablet (15 mg total) by mouth 2 (two) times daily with a meal. 42 tablet 0   No current facility-administered medications on file prior to visit.     Allergies  Allergen Reactions  . Gabapentin Swelling    Numbness also c/o.  Swelling occurred legs.    Family History  Problem Relation Age of Onset  . Stroke Father   . Stroke Maternal Grandmother   . Stroke Maternal Grandfather   . Stroke Paternal Grandmother   . Stroke Paternal Grandfather    BP 110/62   Pulse 75   Ht 5\' 8"  (1.727 m)   Wt 195 lb (88.5 kg)   SpO2 93%   BMI 29.65 kg/m   Review of Systems Leg edema persists.  Denies depression.     Objective:   Physical Exam VITAL SIGNS:  See vs page GENERAL: no distress Neck: a healed scar is present.  i do not appreciate a nodule in the thyroid or elsewhere in the neck.   outside test results are reviewed: Creat=1.15 Ca++=10.0 Lab Results  Component Value Date   TSH 0.43 01/19/2016      Assessment & Plan:  Postsurgical hypothyroidism: well-replaced Differentiated thyroid cancer: no evidence of recurrence. Osteoporosis: due for recheck

## 2016-01-20 LAB — THYROGLOBULIN ANTIBODY: Thyroglobulin Ab: <1 IU/mL (ref <2)

## 2016-01-20 LAB — THYROGLOBULIN LEVEL

## 2016-03-30 ENCOUNTER — Other Ambulatory Visit: Payer: Self-pay

## 2016-03-30 MED ORDER — RIVAROXABAN 20 MG PO TABS: 20.0000 mg | ORAL_TABLET | Freq: Every day | ORAL | 2 refills | Status: DC

## 2016-04-25 ENCOUNTER — Other Ambulatory Visit: Payer: Self-pay | Admitting: Endocrinology

## 2016-06-06 ENCOUNTER — Encounter: Payer: Self-pay | Admitting: Vascular Surgery

## 2016-06-14 ENCOUNTER — Encounter: Payer: Self-pay | Admitting: Vascular Surgery

## 2016-06-14 ENCOUNTER — Ambulatory Visit (INDEPENDENT_AMBULATORY_CARE_PROVIDER_SITE_OTHER): Payer: Medicaid Other | Admitting: Vascular Surgery

## 2016-06-14 VITALS — BP 126/76 | HR 69 | Temp 98.0°F | Resp 16 | Ht 68.0 in | Wt 196.0 lb

## 2016-06-14 DIAGNOSIS — I82513 Chronic embolism and thrombosis of femoral vein, bilateral: Secondary | ICD-10-CM

## 2016-06-14 NOTE — Progress Notes (Signed)
[facility] and [facility] Specialist [facility]  Patient name: LEVY SCOTTO MRN: [id number] DOB: 10-14-1955 Sex: female  REASON FOR VISIT: Follow-up DVT  HPI: Amanda Meyers is a 61 y.o. female here today for follow-up. 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 She was placed on Xarelto probably 6 months ago. She has had a complication of a fall with hematoma in her left knee and required surgery for drainage of this. No new episodes of DVT  Past Medical History:  Diagnosis Date  . Cancer (HCC)    Breast  . Dyslipidemia   . History of DVT (deep vein thrombosis)   . History of papillary adenocarcinoma of thyroid    Stage 1  . Hypertension   . Hypothyroidism   . Lung disease   . S/P thyroidectomy 11/2005   Suspicious nodules, path- 2 foci left lobe (10 &103mm) I-131 Rx 02/2006, neg thyrogen scan 10/2006, thyroglobulin  0.7 neg antibody 11/2006 07/2007 01/2008 07/2008 07/2009  . Surgical hypoparathyroidism ([place])   . Urinary incontinence   . Urolithiasis     Family History  Problem Relation Age of Onset  . Stroke Father   . Stroke Maternal Grandmother   . Stroke Maternal Grandfather   . Stroke Paternal Grandmother   . Stroke Paternal Grandfather     SOCIAL HISTORY: Social History  Substance Use Topics  . Smoking status: Never Smoker  . Smokeless tobacco: Never Used  . Alcohol use No    Allergies  Allergen Reactions  . Gabapentin Swelling    Numbness also c/o.  Swelling occurred legs.    Current Outpatient Prescriptions  Medication Sig Dispense Refill  . anastrozole (ARIMIDEX) 1 MG tablet Take 1 mg by mouth daily.      Marland Kitchen aspirin 81 MG EC tablet Take 81 mg by mouth daily.      Marland Kitchen atorvastatin (LIPITOR) 20 MG tablet Take 20 mg by mouth daily. Reported on 09/23/2015    . chlorhexidine (PERIDEX) 0.12 % solution Use as directed 15 mLs in the mouth or  throat 2 (two) times daily.    . cycloSPORINE (RESTASIS) 0.05 % ophthalmic emulsion Place 1 drop into both eyes 2 (two) times daily.      Marland Kitchen desmopressin (DDAVP) 0.2 MG tablet Take 0.2 mg by mouth daily.    Marland Kitchen desonide (DESOWEN) 0.05 % cream Apply topically 2 (two) times daily.    Marland Kitchen doxycycline (VIBRA-TABS) 100 MG tablet Take 100 mg by mouth daily.      . fluorometholone (FML) 0.1 % ophthalmic suspension Place 1 drop into both eyes every 4 (four) hours.    . fluticasone (FLONASE) 50 MCG/ACT nasal spray Place into both nostrils daily. Reported on 09/23/2015    . furosemide (LASIX) 40 MG tablet Take by mouth.    . hydrochlorothiazide (HYDRODIURIL) 25 MG tablet Take 25 mg by mouth daily.    . irbesartan (AVAPRO) 300 MG tablet Take 300 mg by mouth daily.    Marland Kitchen levothyroxine (SYNTHROID, LEVOTHROID) 112 MCG tablet TAKE 1 TABLET BY MOUTH EVERY DAY BEFORE BREAKFAST 30 tablet 11  . metoprolol (TOPROL-XL) 100 MG 24 hr tablet Take 100 mg by mouth daily.      . Olopatadine HCl (PAZEO OP) Apply to eye.    Marland Kitchen omeprazole (PRILOSEC) 20 MG capsule Take 20 mg by mouth daily.      . phentermine (ADIPEX-P) 37.5 MG  tablet Take 37.5 mg by mouth every morning before breakfast.      . polyethylene glycol (MIRALAX / GLYCOLAX) packet Take 17 g by mouth daily.    . Rivaroxaban (XARELTO) 15 MG TABS tablet Take 1 tablet (15 mg total) by mouth 2 (two) times daily with a meal. 42 tablet 0  . rivaroxaban (XARELTO) 20 MG TABS tablet Take 1 tablet (20 mg total) by mouth daily with supper. Start Xarelto 20 mg daily on Day 22 (12-22-2015). 30 tablet 2  . simvastatin (ZOCOR) 20 MG tablet Take 20 mg by mouth daily.    . solifenacin (VESICARE) 10 MG tablet Take 5 mg by mouth daily.      . valsartan (DIOVAN) 160 MG tablet Take 160 mg by mouth daily.     No current facility-administered medications for this visit.     REVIEW OF SYSTEMS:  [X]  denotes positive finding, [ ]  denotes negative finding Cardiac  Comments:  Chest pain or  chest pressure:    Shortness of breath upon exertion:    Short of breath when lying flat:    Irregular heart rhythm:        Vascular    Pain in calf, thigh, or hip brought on by ambulation:    Pain in feet at night that wakes you up from your sleep:     Blood clot in your veins:    Leg swelling:  x         PHYSICAL EXAM: Vitals:   06/14/16 1147  BP: 126/76  Pulse: 69  Resp: 16  Temp: 98 F (36.7 C)  SpO2: 97%  Weight: 196 lb (88.9 kg)  Height: 5\' 8"  (1.727 m)    GENERAL: The patient is a well-nourished female, in no acute distress. The vital signs are documented above. CARDIOVASCULAR: Palpable pedal pulses bilaterally. PULMONARY: There is good air exchange  MUSCULOSKELETAL: There are no major deformities or cyanosis. NEUROLOGIC: No focal weakness or paresthesias are detected. SKIN: There are no ulcers or rashes noted. Incision in her left knee appears to be healing. Marked bilateral lower extremity edema with some thickening in the skin of her left calf and ankle PSYCHIATRIC: The patient has a normal affect.    MEDICAL ISSUES: History of prior DVT now 6 months out being on anticoagulation. Have advised her to discontinue her anticoagulation. Do not feel this is indicated in that she develops recurrent difficulty with DVT. She will see [place] again on an as-needed basis    Rosetta Posner, MD [facility] Tel [phone] Pager [phone]

## 2016-06-24 ENCOUNTER — Other Ambulatory Visit: Payer: Self-pay

## 2016-06-24 MED ORDER — RIVAROXABAN 20 MG PO TABS: 20.0000 mg | ORAL_TABLET | Freq: Every day | ORAL | 3 refills | Status: DC

## 2016-07-06 ENCOUNTER — Ambulatory Visit (INDEPENDENT_AMBULATORY_CARE_PROVIDER_SITE_OTHER): Payer: Medicaid Other | Admitting: Urology

## 2016-07-06 DIAGNOSIS — R3915 Urgency of urination: Secondary | ICD-10-CM

## 2016-07-06 DIAGNOSIS — R351 Nocturia: Secondary | ICD-10-CM | POA: Diagnosis not present

## 2016-10-12 ENCOUNTER — Ambulatory Visit (INDEPENDENT_AMBULATORY_CARE_PROVIDER_SITE_OTHER): Payer: Medicaid Other | Admitting: Urology

## 2016-10-12 DIAGNOSIS — R351 Nocturia: Secondary | ICD-10-CM

## 2016-10-12 DIAGNOSIS — R3915 Urgency of urination: Secondary | ICD-10-CM | POA: Diagnosis not present

## 2016-10-31 ENCOUNTER — Other Ambulatory Visit: Payer: Self-pay | Admitting: Medical Oncology

## 2016-10-31 DIAGNOSIS — Z1231 Encounter for screening mammogram for malignant neoplasm of breast: Secondary | ICD-10-CM

## 2016-11-30 ENCOUNTER — Other Ambulatory Visit: Payer: Self-pay | Admitting: Obstetrics and Gynecology

## 2016-11-30 DIAGNOSIS — Z1231 Encounter for screening mammogram for malignant neoplasm of breast: Secondary | ICD-10-CM

## 2016-12-08 ENCOUNTER — Ambulatory Visit (HOSPITAL_COMMUNITY): Payer: Medicaid Other

## 2016-12-22 ENCOUNTER — Ambulatory Visit (HOSPITAL_COMMUNITY)
Admission: RE | Admit: 2016-12-22 | Discharge: 2016-12-22 | Disposition: A | Discharge status: Home / Self Care | Payer: Self-pay | Source: Ambulatory Visit | Attending: Obstetrics and Gynecology | Admitting: Obstetrics and Gynecology

## 2016-12-22 ENCOUNTER — Other Ambulatory Visit: Payer: Self-pay | Admitting: Obstetrics and Gynecology

## 2016-12-22 ENCOUNTER — Ambulatory Visit
Admission: RE | Admit: 2016-12-22 | Discharge: 2016-12-22 | Disposition: A | Discharge status: Home / Self Care | Payer: No Typology Code available for payment source | Source: Ambulatory Visit | Attending: Obstetrics and Gynecology | Admitting: Obstetrics and Gynecology

## 2016-12-22 ENCOUNTER — Encounter (HOSPITAL_COMMUNITY): Payer: Self-pay

## 2016-12-22 VITALS — BP 149/82 | HR 73 | Temp 98.2°F | Ht 68.0 in | Wt 187.4 lb

## 2016-12-22 DIAGNOSIS — Z1231 Encounter for screening mammogram for malignant neoplasm of breast: Secondary | ICD-10-CM

## 2016-12-22 DIAGNOSIS — Z1239 Encounter for other screening for malignant neoplasm of breast: Secondary | ICD-10-CM

## 2016-12-22 NOTE — Progress Notes (Signed)
No complaints today.   Pap Smear: Pap smear not completed today. Last Pap smear was 4-5 years ago and normal per patient. Per patient has no history of an abnormal Pap smear. Patient has a history of a hysterectomy in 1999 due to fibroids and AUB. Patient no longer needs Pap smears due to her history of a hysterectomy for benign reasons per BCCCP and ACOG guidelines. No Pap smear results are in EPIC.  Physical exam: Breasts Breasts not symmetrical due to history of a left breast mastectomy in 2002. Patient has bilateral breast implants. No skin abnormalities bilateral breasts. No nipple retraction right breast. No nipple discharge right breast. No lymphadenopathy. No lumps palpated bilateral breasts. No complaints of pain or tenderness on exam. Referred patient to the [facility] for a screening mammogram. Appointment scheduled for Thursday, December 22, 2016 at 1410.        Pelvic/Bimanual No Pap smear completed today since patient has a history of a hysterectomy for benign reasons. Pap smear not indicated per BCCCP guidelines.   Smoking History: Patient has never smoked.  Patient Navigation: Patient education provided. Access to services provided for patient through [facility] program.   Colorectal Cancer Screening: Per patient had a colonoscopy completed in 2016. No complaints today. FIT Test given to patient to complete and return to BCCCP.

## 2016-12-22 NOTE — Patient Instructions (Signed)
Explained breast self awareness with Real Cons Schoene. Patient did not need a Pap smear today due to her history of a hysterectomy for benign reasons. Let patient know that she doesn't need any further Pap smears due to her history of a hysterectomy for benign reasons. Referred patient to the [facility] for a screening mammogram. Appointment scheduled for Thursday, December 22, 2016 at 1410. Let patient know the [facility] will follow up with her within the next couple weeks with results of mammogram by letter or phone. Amanda Meyers verbalized understanding.  Amanda Meyers, Arvil Chaco, RN 3:55 PM

## 2016-12-23 ENCOUNTER — Other Ambulatory Visit: Payer: Self-pay | Admitting: Obstetrics and Gynecology

## 2016-12-23 DIAGNOSIS — R928 Other abnormal and inconclusive findings on diagnostic imaging of breast: Secondary | ICD-10-CM

## 2016-12-30 ENCOUNTER — Encounter (HOSPITAL_COMMUNITY): Payer: Self-pay

## 2016-12-30 ENCOUNTER — Other Ambulatory Visit: Payer: No Typology Code available for payment source

## 2017-01-03 LAB — FECAL OCCULT BLOOD, IMMUNOCHEMICAL: FECAL OCCULT BLD: NEGATIVE

## 2017-01-04 ENCOUNTER — Encounter (HOSPITAL_COMMUNITY): Payer: Self-pay

## 2017-01-18 ENCOUNTER — Encounter: Payer: Self-pay | Admitting: Endocrinology

## 2017-01-18 ENCOUNTER — Ambulatory Visit (INDEPENDENT_AMBULATORY_CARE_PROVIDER_SITE_OTHER): Payer: Self-pay | Admitting: Endocrinology

## 2017-01-18 VITALS — BP 142/94 | HR 67 | Wt 183.4 lb

## 2017-01-18 DIAGNOSIS — E890 Postprocedural hypothyroidism: Secondary | ICD-10-CM

## 2017-01-18 DIAGNOSIS — Z23 Encounter for immunization: Secondary | ICD-10-CM

## 2017-01-18 LAB — TSH: TSH: 0.75 u[IU]/mL (ref 0.35–4.50)

## 2017-01-18 NOTE — Progress Notes (Signed)
Subjective:    Patient ID: Amanda Meyers, female    DOB: October 14, 1955, 61 y.o.   MRN: [id number]  HPI The state of at least three ongoing medical problems is addressed today: stage 1 papillary adenocarcinoma thyroid.  Pt says she does not notice any nodule at the anterior neck. 8/07    thyroidect for suspicious nodules 8/07    pathol: 2 foci left lobe (10 and 8 mm).  11/07  131-i rx [facility] .  7/08    neg thyrogen scan. 8/08  thyroglobulin=0.7 (antibody neg) 4/09  tg and anti-tg antibody are neg.  0/09  tg and anti-tg antibody are neg.  4/10  tg and anti-tg antibody are neg.  4/11  tg and anti-tg antibody are neg 9/12  tg and anti-tg antibody are neg 9/14  tg and anti-tg antibody are neg 9/15  tg and anti-tg antibody are neg 9/16 tg and anti-tg antibody are neg 9/17 tg and anti-tg antibody are neg Postsurgical hypothyroidism: she has recently lost 10 lbs.  Osteoporosis: she has never been on rx for this.  Denies falls.  Dr Manuella Ghazi now follows this Past Medical History:  Diagnosis Date  . Cancer (HCC)    Breast  . Dyslipidemia   . History of DVT (deep vein thrombosis)   . History of papillary adenocarcinoma of thyroid    Stage 1  . Hypertension   . Hypothyroidism   . Lung disease   . S/P thyroidectomy 11/2005   Suspicious nodules, path- 2 foci left lobe (10 &36mm) I-131 Rx 02/2006, neg thyrogen scan 10/2006, thyroglobulin  0.7 neg antibody 11/2006 07/2007 01/2008 07/2008 07/2009  . Surgical hypoparathyroidism ([place])   . Urinary incontinence   . Urolithiasis     Past Surgical History:  Procedure Laterality Date  . AUGMENTATION MAMMAPLASTY Bilateral   . MASTECTOMY Left 2002  . THYROIDECTOMY  2007    Social History   Social History  . Marital status: Single    Spouse name: N/A  . Number of children: 0  . Years of education: HS   Occupational History  .  Unemployed   Social History Main Topics  . Smoking status: Never Smoker  . Smokeless tobacco: Never Used    . Alcohol use No  . Drug use: No  . Sexual activity: Not Currently    Partners: Male    Birth control/ protection: Post-menopausal   Other Topics Concern  . Not on file   Social History Narrative   Lives at [place], [place])   Patient lives at home with her mother 07/19/13   Caffeine Use: 3-4 cups daily    Current Outpatient Prescriptions on File Prior to Visit  Medication Sig Dispense Refill  . anastrozole (ARIMIDEX) 1 MG tablet Take 1 mg by mouth daily.      Marland Kitchen aspirin 81 MG EC tablet Take 81 mg by mouth daily.      Marland Kitchen atorvastatin (LIPITOR) 20 MG tablet Take 20 mg by mouth daily. Reported on 09/23/2015    . chlorhexidine (PERIDEX) 0.12 % solution Use as directed 15 mLs in the mouth or throat 2 (two) times daily.    . cycloSPORINE (RESTASIS) 0.05 % ophthalmic emulsion Place 1 drop into both eyes 2 (two) times daily.      Marland Kitchen desmopressin (DDAVP) 0.2 MG tablet Take 0.2 mg by mouth daily.    Marland Kitchen desonide (DESOWEN) 0.05 % cream Apply topically 2 (two) times daily.    Marland Kitchen doxycycline (VIBRA-TABS) 100 MG tablet  Take 100 mg by mouth daily.      . fluorometholone (FML) 0.1 % ophthalmic suspension Place 1 drop into both eyes every 4 (four) hours.    . fluticasone (FLONASE) 50 MCG/ACT nasal spray Place into both nostrils daily. Reported on 09/23/2015    . furosemide (LASIX) 40 MG tablet Take by mouth.    . hydrochlorothiazide (HYDRODIURIL) 25 MG tablet Take 25 mg by mouth daily.    . irbesartan (AVAPRO) 300 MG tablet Take 300 mg by mouth daily.    Marland Kitchen levothyroxine (SYNTHROID, LEVOTHROID) 112 MCG tablet TAKE 1 TABLET BY MOUTH EVERY DAY BEFORE BREAKFAST 30 tablet 11  . metoprolol (TOPROL-XL) 100 MG 24 hr tablet Take 100 mg by mouth daily.      . Olopatadine HCl (PAZEO OP) Apply to eye.    Marland Kitchen omeprazole (PRILOSEC) 20 MG capsule Take 20 mg by mouth daily.      . phentermine (ADIPEX-P) 37.5 MG tablet Take 37.5 mg by mouth every morning before breakfast.      . polyethylene glycol (MIRALAX /  GLYCOLAX) packet Take 17 g by mouth daily.    . rivaroxaban (XARELTO) 20 MG TABS tablet Take 1 tablet (20 mg total) by mouth daily with supper. Start Xarelto 20 mg daily on Day 22 (12-22-2015). 30 tablet 3  . simvastatin (ZOCOR) 20 MG tablet Take 20 mg by mouth daily.    . solifenacin (VESICARE) 10 MG tablet Take 5 mg by mouth daily.      . valsartan (DIOVAN) 160 MG tablet Take 160 mg by mouth daily.     No current facility-administered medications on file prior to visit.     Allergies  Allergen Reactions  . Gabapentin Swelling    Numbness also c/o.  Swelling occurred legs.    Family History  Problem Relation Age of Onset  . Stroke Father   . Stroke Maternal Grandmother   . Stroke Maternal Grandfather   . Stroke Paternal Grandmother   . Stroke Paternal Grandfather     BP (!) 142/94   Pulse 67   Wt 183 lb 6.4 oz (83.2 kg)   SpO2 93%   BMI 27.89 kg/m    Review of Systems Denies lump in the neck.     Objective:   Physical Exam VITAL SIGNS:  See vs page GENERAL: no distress Neck: a healed scar is present.  I do not appreciate a nodule in the thyroid or elsewhere in the neck.       Assessment & Plan:  Postsurgical hypothyroidism: due for recheck Differentiated thyroid cancer: we'll recheck today, but after this, she does not need f/u of this  Patient Instructions  A thyroid blood test is requested for you today.  We'll let you know about the results.   I would be happy to see you back here as needed.

## 2017-01-18 NOTE — Progress Notes (Signed)
   Subjective:    Patient ID: Amanda Meyers, female    DOB: December 11, 1955, 61 y.o.   MRN: [id number]  HPI    Review of Systems     Objective:   Physical Exam        Assessment & Plan:

## 2017-01-18 NOTE — Patient Instructions (Addendum)
A thyroid blood test is requested for you today.  We'll let you know about the results.   I would be happy to see you back here as needed.    

## 2017-01-20 ENCOUNTER — Other Ambulatory Visit: Payer: No Typology Code available for payment source

## 2017-02-13 ENCOUNTER — Ambulatory Visit: Admission: RE | Admit: 2017-02-13 | Payer: No Typology Code available for payment source | Source: Ambulatory Visit

## 2017-02-13 ENCOUNTER — Ambulatory Visit
Admission: RE | Admit: 2017-02-13 | Discharge: 2017-02-13 | Disposition: A | Discharge status: Home / Self Care | Payer: No Typology Code available for payment source | Source: Ambulatory Visit | Attending: Obstetrics and Gynecology | Admitting: Obstetrics and Gynecology

## 2017-02-13 DIAGNOSIS — R928 Other abnormal and inconclusive findings on diagnostic imaging of breast: Secondary | ICD-10-CM

## 2017-03-06 ENCOUNTER — Encounter (HOSPITAL_COMMUNITY): Payer: Self-pay | Admitting: *Deleted

## 2017-04-12 ENCOUNTER — Ambulatory Visit (INDEPENDENT_AMBULATORY_CARE_PROVIDER_SITE_OTHER): Payer: Self-pay | Admitting: Urology

## 2017-04-12 DIAGNOSIS — R3915 Urgency of urination: Secondary | ICD-10-CM

## 2017-04-12 DIAGNOSIS — R351 Nocturia: Secondary | ICD-10-CM

## 2017-04-13 ENCOUNTER — Other Ambulatory Visit: Payer: Self-pay | Admitting: Endocrinology

## 2017-11-10 ENCOUNTER — Other Ambulatory Visit: Payer: Self-pay | Admitting: Obstetrics and Gynecology

## 2017-11-14 ENCOUNTER — Other Ambulatory Visit: Payer: Self-pay | Admitting: Obstetrics and Gynecology

## 2017-11-14 DIAGNOSIS — Z1231 Encounter for screening mammogram for malignant neoplasm of breast: Secondary | ICD-10-CM

## 2018-01-24 ENCOUNTER — Ambulatory Visit (INDEPENDENT_AMBULATORY_CARE_PROVIDER_SITE_OTHER): Payer: Self-pay | Admitting: Urology

## 2018-01-24 DIAGNOSIS — R351 Nocturia: Secondary | ICD-10-CM

## 2018-01-24 DIAGNOSIS — R3915 Urgency of urination: Secondary | ICD-10-CM

## 2018-02-15 ENCOUNTER — Ambulatory Visit: Payer: No Typology Code available for payment source

## 2018-02-15 ENCOUNTER — Ambulatory Visit (HOSPITAL_COMMUNITY)
Admission: RE | Admit: 2018-02-15 | Discharge: 2018-02-15 | Disposition: A | Discharge status: Home / Self Care | Payer: No Typology Code available for payment source | Source: Ambulatory Visit | Attending: Obstetrics and Gynecology | Admitting: Obstetrics and Gynecology

## 2018-02-15 ENCOUNTER — Encounter (HOSPITAL_COMMUNITY): Payer: Self-pay

## 2018-02-15 ENCOUNTER — Ambulatory Visit
Admission: RE | Admit: 2018-02-15 | Discharge: 2018-02-15 | Disposition: A | Discharge status: Home / Self Care | Payer: Self-pay | Source: Ambulatory Visit | Attending: Obstetrics and Gynecology | Admitting: Obstetrics and Gynecology

## 2018-02-15 VITALS — BP 112/64

## 2018-02-15 DIAGNOSIS — Z1239 Encounter for other screening for malignant neoplasm of breast: Secondary | ICD-10-CM

## 2018-02-15 DIAGNOSIS — Z1231 Encounter for screening mammogram for malignant neoplasm of breast: Secondary | ICD-10-CM

## 2018-02-15 NOTE — Patient Instructions (Addendum)
Explained breast self awareness with [name]. Patient did not need a Pap smear today due to her history of a hysterectomy for benign reasons. Let patient know that she doesn't need any further Pap smears due to her history of a hysterectomy for benign reasons. Referred patient to the [facility] for a screening mammogram. Appointment scheduled for Thursday, February 15, 2018 at 1510. Let patient know the [facility] Center will follow up with her within the next couple weeks with results of mammogram by letter or phone. Amanda Meyers verbalized understanding.  Anaid Haney, Arvil Chaco, RN 3:04 PM

## 2018-02-15 NOTE — Progress Notes (Signed)
No complaints today.   Pap Smear: Pap smear not completed today. Last Pap smear was 5-6 years ago and normal per patient. Per patient has no history of an abnormal Pap smear. Patient has a history of a hysterectomy in 1999 due to fibroids and AUB. Patient no longer needs Pap smears due to her history of a hysterectomy for benign reasons per BCCCP and ACOG guidelines. No Pap smear results are in EPIC.  Physical exam: Breasts Breasts not symmetrical due to history of a left breast mastectomy in 2002. Patient has bilateral breast implants. No skin abnormalities bilateral breasts. No nipple retraction right breast. No nipple discharge right breast. No lymphadenopathy. No lumps palpated bilateral breasts. No complaints of pain or tenderness on exam. Referred patient to the [facility] for a screening mammogram. Appointment scheduled for Thursday, February 15, 2018 at 1510.        Pelvic/Bimanual No Pap smear completed today since patient has a history of a hysterectomy for benign reasons. Pap smear not indicated per BCCCP guidelines.   Smoking History: Patient has never smoked.  Patient Navigation: Patient education provided. Access to services provided for patient through [facility] program.   Colorectal Cancer Screening: Per patient had a colonoscopy completed in 2016. Patient completed a FIT Test 12/22/2016 that was negative. No complaints today. FIT Test given to patient to complete and return to BCCCP.  Breast and Cervical Cancer Risk Assessment: Patient has no family history of breast cancer. Patient has a history of breast cancer in 2003. Patient has no known genetic mutations or history of radiation treatment to the chest before age 82. Patient has no history of cervical dysplasia, immunocompromised, or DES exposure in-utero.  Risk Assessment    Risk Scores      02/15/2018   Last edited by: Armond Hang, LPN   5-year risk: 2 %   Lifetime risk: 9 %

## 2018-02-16 ENCOUNTER — Other Ambulatory Visit: Payer: Self-pay

## 2018-02-23 LAB — FECAL OCCULT BLOOD, IMMUNOCHEMICAL

## 2018-02-26 ENCOUNTER — Encounter (HOSPITAL_COMMUNITY): Payer: Self-pay | Admitting: *Deleted

## 2018-03-23 ENCOUNTER — Other Ambulatory Visit: Payer: Self-pay

## 2018-03-28 LAB — FECAL OCCULT BLOOD, IMMUNOCHEMICAL: Fecal Occult Bld: NEGATIVE

## 2018-04-02 ENCOUNTER — Encounter (HOSPITAL_COMMUNITY): Payer: Self-pay

## 2018-04-09 ENCOUNTER — Other Ambulatory Visit: Payer: Self-pay | Admitting: Endocrinology

## 2018-04-09 NOTE — Telephone Encounter (Signed)
Last OV 01/18/17 refill or refuse please advise

## 2018-04-09 NOTE — Telephone Encounter (Signed)
Options: 1.  Please refill x 1.  Ov is due 2.  Ask PCP for refill

## 2018-07-29 ENCOUNTER — Other Ambulatory Visit: Payer: Self-pay | Admitting: Endocrinology

## 2018-07-29 NOTE — Telephone Encounter (Signed)
Options: Ask PCP to refill, or: Please refill x 1, and Ov is due

## 2018-08-29 ENCOUNTER — Other Ambulatory Visit (HOSPITAL_COMMUNITY): Payer: Self-pay | Admitting: *Deleted

## 2018-08-29 DIAGNOSIS — Z1231 Encounter for screening mammogram for malignant neoplasm of breast: Secondary | ICD-10-CM

## 2019-02-21 ENCOUNTER — Ambulatory Visit
Admission: RE | Admit: 2019-02-21 | Discharge: 2019-02-21 | Disposition: A | Discharge status: Home / Self Care | Payer: No Typology Code available for payment source | Source: Ambulatory Visit | Attending: Obstetrics and Gynecology | Admitting: Obstetrics and Gynecology

## 2019-02-21 ENCOUNTER — Encounter (HOSPITAL_COMMUNITY): Payer: Self-pay

## 2019-02-21 ENCOUNTER — Ambulatory Visit (HOSPITAL_COMMUNITY)
Admission: RE | Admit: 2019-02-21 | Discharge: 2019-02-21 | Disposition: A | Discharge status: Home / Self Care | Payer: No Typology Code available for payment source | Source: Ambulatory Visit | Attending: Obstetrics and Gynecology | Admitting: Obstetrics and Gynecology

## 2019-02-21 ENCOUNTER — Other Ambulatory Visit: Payer: Self-pay

## 2019-02-21 DIAGNOSIS — Z1231 Encounter for screening mammogram for malignant neoplasm of breast: Secondary | ICD-10-CM

## 2019-02-21 DIAGNOSIS — Z1239 Encounter for other screening for malignant neoplasm of breast: Secondary | ICD-10-CM

## 2019-02-21 NOTE — Patient Instructions (Addendum)
Explained breast self awareness with Real Cons Feeley. Patient did not need a Pap smear today due to her history of a hysterectomy for benign reasons. Let patient know that she doesn't need any further Pap smears due to her history of a hysterectomy for benign reasons. Referred patient to the [facility] for a screening mammogram. Appointment scheduled for Thursday, February 21, 2019 at 1420. Let patient know the [facility] will follow up with her within the next couple weeks with results of mammogram by letter or phone. Amanda Meyers verbalized understanding.  Amanda Meyers, Arvil Chaco, RN 1:18 PM

## 2019-02-21 NOTE — Progress Notes (Signed)
No complaints today.   Pap Smear: Pap smear not completed today. Last Pap smear was 6-7 years ago and normal per patient. Per patient has no history of an abnormal Pap smear. Patient has a history of a hysterectomy in 1999 due to fibroids and AUB. Patient no longer needs Pap smears due to her history of a hysterectomy for benign reasons per BCCCP and ACOG guidelines. No Pap smear results are in EPIC.  Physical exam: Breasts Breastsnot symmetrical due to history of a left breast mastectomy in 2002. Patient has bilateral breast implants.No skin abnormalities bilateral breasts. No nipple retraction right breast.No nipple dischargeright breast.No lymphadenopathy. No lumps palpated bilateral breasts. No complaints of pain or tenderness on exam. Referred patient to the [facility] for a screening mammogram. Appointment scheduled for Thursday, February 21, 2019 at 1420.         Pelvic/Bimanual No Pap smear completed today sincepatient has a history of a hysterectomy for benign reasons.Pap smear not indicated per BCCCP guidelines.  Smoking History: Patient has never smoked.  Patient Navigation: Patient education provided. Access to services provided for patient through [facility] program.   Colorectal Cancer Screening: Per patient had a colonoscopy completed in 2010. Patient completed a FIT Test 03/23/2018 that was negative. No complaints today.  Breast and Cervical Cancer Risk Assessment: Patient has no family history of breast cancer. Patient has a personal history of breast cancer in 2003. Patient has no known genetic mutations or history of radiation treatment to the chest before age 41. Patient has no history of cervical dysplasia, immunocompromised, or DES exposure in-utero.   Risk Assessment    Risk Scores      02/21/2019 02/15/2018   Last edited by: Loletta Parish, RN Armond Hang, LPN   5-year risk: 2.1 % 2 %   Lifetime risk: 8.7 % 9 %

## 2019-03-13 ENCOUNTER — Other Ambulatory Visit: Payer: Self-pay

## 2019-03-13 ENCOUNTER — Inpatient Hospital Stay: Payer: Self-pay | Attending: Obstetrics and Gynecology | Admitting: *Deleted

## 2019-03-13 ENCOUNTER — Other Ambulatory Visit: Payer: Self-pay | Admitting: Obstetrics and Gynecology

## 2019-03-13 VITALS — BP 164/100 | Temp 97.7°F | Ht 68.0 in | Wt 170.0 lb

## 2019-03-13 DIAGNOSIS — Z0000 Encounter for general adult medical examination without abnormal findings: Secondary | ICD-10-CM

## 2019-03-13 NOTE — Progress Notes (Signed)
[facility] initial screening    Clinical Measurement:  Height: 68 in Weight:170 lb  Blood Pressure: 188/99  Blood Pressure #2: 164/100 Fasting Labs Drawn Today, will review with patient when they result.   Medical History:  Patient states that she has a history of high cholesterol and high blood pressure. Patient does not have a history of diabetes.  Medications:  Patient states that she takes medication to lower cholesterol and blood pressure. Patient does not take medication to lower blood sugar. Patient does take an aspirin a day to help prevent a heart attack or stroke. During the past 7 days patient has taken prescribed medication to lower cholesterol and blood pressure on 7 days.   Blood pressure, self measurement: Patient states that she does not measure blood pressure from home. Patient has blood pressure monitor but does not have it at her current home.   Nutrition: Patient states that on average she eats 1 cup of fruit and 1 cup of vegetables per day. Patient states that she does eat fish at least 2 times per week. Patient eats about half servings of whole grains. Patient drinks less than 36 ounces of beverages with added sugar weekly. Patient is currently watching sodium or salt intake. In the past 7 days patient has not had any drinks containing alcohol. On average patient does not drink any drinks containing alcohol.      Physical activity:  Patient states that she gets 0 minutes of moderate and 0 minutes of vigorous physical activity each week.  Smoking status:  Patient states that she has never smoked tobacco.   Quality of life:  Over the past 2 weeks patient states that she has had several days where she has little interest or pleasure in doing things and several days where she has felt down, depressed or hopeless.    Risk reduction and counseling:    Health Coaching: Encouraged patient to add more fruits and vegetables into diet. Explained that the recommendation is for 2 cups of  fruit and 3 cups of vegetables per day. Encouraged patient to continue watching diet because of history of elevated cholesterol. Encouraged patient to watch the amount of fried and fatty foods that she consumes as well as red meat and full fat dairy products. Encouraged patient to continue low sodium diet given hx of high blood pressure. Reminded patient that she also needs to take her medications every day and at the same time every day.   Navigation:  I will notify patient of lab results. Patient is aware of 2 more health coaching sessions and a follow up. Will be referring patient for elevated BP to Internal Medicine.  Time: 30 minutes

## 2019-03-14 LAB — LIPID PANEL W/O CHOL/HDL RATIO
Cholesterol, Total: 158 mg/dL (ref 100–199)
HDL: 61 mg/dL (ref >39)
LDL Chol Calc (NIH): 81 mg/dL (ref 0–99)
Triglycerides: 84 mg/dL (ref 0–149)
VLDL Cholesterol Cal: 16 mg/dL (ref 5–40)

## 2019-03-14 LAB — HGB A1C W/O EAG: Hgb A1c MFr Bld: 5 % (ref 4.8–5.6)

## 2019-03-14 LAB — GLUCOSE, RANDOM: Glucose: 84 mg/dL (ref 65–99)

## 2019-03-20 ENCOUNTER — Telehealth: Payer: Self-pay

## 2019-03-20 NOTE — Telephone Encounter (Signed)
Health coaching 2     Labs- 158 cholesterol , 81 LDL cholesterol , 84 triglycerides, 61 HDL cholesterol , 5.0 hemoglobin A1C , 84 mean plasma glucose   Patient understands and is aware of her lab results.   Goals- Discussed lab results with patient and answered any questions that patient had regarding results. Encouraged patient to try and add more fruits and vegetables in diet. Explained that the recommendation is for 2 cups of fruit and 3 cups of vegetables daily. Encouraged patient to continue watching diet and reducing the amount of fried and fatty foods that the patient consumes, given her history of elevated cholesterol. Encouraged patient to also continue eating fish high in omega 3's and whole grains.    Navigation:  Patient is aware of 1 more health coaching sessions and a follow up. Patient is scheduled with Internal Medicine on March 26, 2019 @ 10:15 am for follow-up for elevated BP.  Time- 10 minues

## 2019-03-26 ENCOUNTER — Ambulatory Visit (INDEPENDENT_AMBULATORY_CARE_PROVIDER_SITE_OTHER): Payer: Self-pay | Admitting: Internal Medicine

## 2019-03-26 ENCOUNTER — Encounter: Payer: Self-pay | Admitting: Internal Medicine

## 2019-03-26 ENCOUNTER — Other Ambulatory Visit: Payer: Self-pay

## 2019-03-26 VITALS — BP 153/92 | HR 83 | Temp 98.7°F | Ht 68.0 in | Wt 176.2 lb

## 2019-03-26 DIAGNOSIS — Z1239 Encounter for other screening for malignant neoplasm of breast: Secondary | ICD-10-CM

## 2019-03-26 DIAGNOSIS — R32 Unspecified urinary incontinence: Secondary | ICD-10-CM

## 2019-03-26 DIAGNOSIS — E785 Hyperlipidemia, unspecified: Secondary | ICD-10-CM | POA: Insufficient documentation

## 2019-03-26 DIAGNOSIS — Z79890 Hormone replacement therapy (postmenopausal): Secondary | ICD-10-CM

## 2019-03-26 DIAGNOSIS — Z9010 Acquired absence of unspecified breast and nipple: Secondary | ICD-10-CM

## 2019-03-26 DIAGNOSIS — N3941 Urge incontinence: Secondary | ICD-10-CM

## 2019-03-26 DIAGNOSIS — E782 Mixed hyperlipidemia: Secondary | ICD-10-CM

## 2019-03-26 DIAGNOSIS — I10 Essential (primary) hypertension: Secondary | ICD-10-CM

## 2019-03-26 DIAGNOSIS — E890 Postprocedural hypothyroidism: Secondary | ICD-10-CM

## 2019-03-26 DIAGNOSIS — Z79899 Other long term (current) drug therapy: Secondary | ICD-10-CM

## 2019-03-26 DIAGNOSIS — Z888 Allergy status to other drugs, medicaments and biological substances status: Secondary | ICD-10-CM

## 2019-03-26 DIAGNOSIS — K219 Gastro-esophageal reflux disease without esophagitis: Secondary | ICD-10-CM

## 2019-03-26 MED ORDER — LEVOTHYROXINE SODIUM 100 MCG PO TABS: 100.0000 ug | ORAL_TABLET | Freq: Every day | ORAL | 3 refills | Status: DC

## 2019-03-26 MED ORDER — LOSARTAN POTASSIUM 100 MG PO TABS: 100.0000 mg | ORAL_TABLET | Freq: Every day | ORAL | 3 refills | Status: DC

## 2019-03-26 MED ORDER — AMLODIPINE BESYLATE 5 MG PO TABS: 5.0000 mg | ORAL_TABLET | Freq: Every day | ORAL | 3 refills | Status: DC

## 2019-03-26 MED ORDER — OXYBUTYNIN CHLORIDE 5 MG PO TABS: 5.0000 mg | ORAL_TABLET | Freq: Three times a day (TID) | ORAL | 3 refills | Status: DC

## 2019-03-26 MED ORDER — METOPROLOL SUCCINATE ER 100 MG PO TB24: 100.0000 mg | ORAL_TABLET | Freq: Every day | ORAL | 3 refills | Status: DC

## 2019-03-26 MED ORDER — ROSUVASTATIN CALCIUM 5 MG PO TABS: 5.0000 mg | ORAL_TABLET | Freq: Every day | ORAL | 3 refills | Status: DC

## 2019-03-26 MED ORDER — OMEPRAZOLE 20 MG PO CPDR: 20.0000 mg | DELAYED_RELEASE_CAPSULE | Freq: Every day | ORAL | 3 refills | Status: DC

## 2019-03-26 MED FILL — OMEPRAZOLE 20 MG CAPSULE DR: 20 | 30 days supply | Qty: 30 | Fill #0

## 2019-03-26 MED FILL — ROSUVASTATIN CALCIUM 5 MG T: 5 | 30 days supply | Qty: 30 | Fill #0

## 2019-03-26 MED FILL — AMLODIPINE BESYLATE 5 MG TA: 5 | 30 days supply | Qty: 30 | Fill #0

## 2019-03-26 MED FILL — LEVOTHYROXINE 100 MCG TABLE: 100 | 30 days supply | Qty: 30 | Fill #0

## 2019-03-26 MED FILL — METOPROLOL SUCCINATE ER 100: 100 | 30 days supply | Qty: 30 | Fill #0

## 2019-03-26 MED FILL — LOSARTAN POTASSIUM 100 MG T: 100 | 30 days supply | Qty: 30 | Fill #0

## 2019-03-26 NOTE — Progress Notes (Signed)
   CC: Here to establish care due to HTN  HPI:Ms.Yvonna Burks Rattigan is a 63 y.o. female who presents today to establish care for HTN. Please see individual problem based A/P for details.  Review of Systems:   Review of Systems  Constitutional: Negative for chills, diaphoresis and fever.  HENT: Negative for ear pain and sinus pain.   Eyes: Negative for blurred vision, photophobia and redness.  Respiratory: Negative for cough and shortness of breath.   Cardiovascular: Negative for chest pain and leg swelling.  Gastrointestinal: Negative for constipation, diarrhea, nausea and vomiting.  Genitourinary: Negative for flank pain and urgency.  Musculoskeletal: Negative for myalgias.  Neurological: Negative for dizziness and headaches.  Psychiatric/Behavioral: Negative for depression. The patient is not nervous/anxious.    Family History: Family History  Problem Relation Age of Onset  . Stroke Father   . Stroke Maternal Grandmother   . Stroke Maternal Grandfather   . Stroke Paternal Grandmother   . Stroke Paternal Grandfather   The above was personally reviewed with the patient  Social History: Lives at home alone on [address] Never smoked tobacco Never drank EtOH Illicit drug use denied Worked on odd jobs in the past  PMHx: Past Medical History:  Diagnosis Date  . Cancer (HCC)    Breast  . Dyslipidemia   . History of DVT (deep vein thrombosis)   . History of papillary adenocarcinoma of thyroid    Stage 1  . Hypertension   . Hypothyroidism   . Lung disease   . S/P thyroidectomy 11/2005   Suspicious nodules, path- 2 foci left lobe (10 &72mm) I-131 Rx 02/2006, neg thyrogen scan 10/2006, thyroglobulin  0.7 neg antibody 11/2006 07/2007 01/2008 07/2008 07/2009  . Surgical hypoparathyroidism ([facility])   . Urinary incontinence   . Urolithiasis    Surgical Hx: Mastectomy ~23 years prior Thyroidectomy  Skin biopsy, abnormal findings  Allergies: Gabapentin-swelling in the legs   Physical Exam: Vitals:   03/26/19 1011  BP: (!) 153/92  Pulse: 83  Temp: 98.7 F (37.1 C)  TempSrc: Oral  SpO2: 100%  Weight: 176 lb 3.2 oz (79.9 kg)  Height: 5\' 8"  (1.727 m)   General: A/O x4, in no acute distress, afebrile, nondiaphoretic HENT: Head normal, symmetric, no trauma or bruising noted Eyes: PEERL, EMO intact Cardio: RRR, no mrg's  Pulmonary: CTA bilaterally, no wheezing or crackles  Abdomen: Bowel sounds normal, soft, nontender  MSK: BLE nontender, mild edema on the left greater than the right chronic per patient Skin: Bilateral lower extremities nontender to palpation, with normal skin tone equal bilaterally and mild pitting edema of the left Neuro: CN 2-12 grossly intact, no focal deficits Psych: Appropriate affect, not depressed in appearance, engages well  Assessment & Plan:   See Encounters Tab for problem based charting.  Patient discussed with Dr. Philipp Ovens.

## 2019-03-26 NOTE — Patient Instructions (Addendum)
FOLLOW-UP INSTRUCTIONS When: 4-6 wks For: Routine visit with you primary care doctor What to bring: All of your medications  I have refilled the medication that you brought with you today.  We will need to obtain some labs today including a BMP and a TSH to make certain the medications you have been prescribed continue to be appropriate.  Today we discussed her history of thyroidectomy with hypothyroidism, high blood pressure, breast cancer, prior DVT, and urinary incontinence.  Thank you for your visit to the [facility] today. If you have any questions or concerns please call us at [phone].

## 2019-03-26 NOTE — Assessment & Plan Note (Signed)
GERD: Currently stable omeprazole 10 mg daily.  No history of esophagitis per patient.  Given her hyperparathyroidism and osteoporosis she will need a DEXA scan once she has insurance and cautious use of PPIs in the future.  Plan: Refill omeprazole 20mg  daily

## 2019-03-26 NOTE — Assessment & Plan Note (Signed)
Urinary incontinence: Urge urinary incontinence.  Patient has a sudden urge to void at random times throughout the day and night avoid small to large amounts 1 random.  It is not made worse by stress.  It is responded well to oxybutynin in the past.  Plan: Refill oxybutynin 5 mg 3 times daily

## 2019-03-26 NOTE — Assessment & Plan Note (Signed)
  Hypertension: Patient's BP today is 152/93 with a goal of <140/80. The patient endorses adherence to her medication regimen with occasional missed doses of amlodipine. She denied, chest pain, headache, visual changes, lightheadedness, weakness, dizziness on standing, swelling in the feet or ankles.   Plan: Continue losartan increase to 100 mg daily Continue with the pain 5 mg daily Repeat BP in 4 6 weeks BMP today Repeat BMP in 4 to 6 weeks given ACE inhibitor dose increase

## 2019-03-26 NOTE — Assessment & Plan Note (Signed)
Hyperlipidemia: Patient was a prior history of severe hyperlipidemia for which she was placed on statins over the years.  She is currently on simvastatin 20 mg daily.  Most recent lipid panel with total cholesterol 158, HDL 61 and LDL of 81.  Stable on current statin.  The patient avoids red meat and consumes higher amounts of fish and omega-3's.  Plan: Continue statin switch to rosuvastatin 5 mg daily given insurance concerns Repeat lipid panel in 1 to 2 years

## 2019-03-26 NOTE — Progress Notes (Signed)
Internal Medicine Clinic Attending  Case discussed with Dr. Harbrecht at the time of the visit.  We reviewed the resident's history and exam and pertinent patient test results.  I agree with the assessment, diagnosis, and plan of care documented in the resident's note.   

## 2019-03-26 NOTE — Assessment & Plan Note (Signed)
Mammogram from October of 2020 with BiRads category 1. Annual follow-up with screening mammogram.

## 2019-03-26 NOTE — Assessment & Plan Note (Addendum)
Hypothyroidism: Status post thyroidectomy due to papillary carcinoma of the thyroid gland.  The patient is been stable on 100 MCG's daily levothyroxine for some time.  We will refill this.  Plan: Refill their thyroxine 100 MCG's daily TSH today Will need to see Endocrinology again to follow her papillary carcinoma

## 2019-03-27 LAB — BMP8+ANION GAP
Anion Gap: 14 mmol/L (ref 10.0–18.0)
BUN/Creatinine Ratio: 19 (ref 12–28)
BUN: 17 mg/dL (ref 8–27)
CO2: 25 mmol/L (ref 20–29)
Calcium: 8.5 mg/dL — ABNORMAL LOW (ref 8.7–10.3)
Chloride: 103 mmol/L (ref 96–106)
Creatinine, Ser: 0.89 mg/dL (ref 0.57–1.00)
GFR calc Af Amer: 80 mL/min/{1.73_m2} (ref >59)
GFR calc non Af Amer: 69 mL/min/{1.73_m2} (ref >59)
Glucose: 84 mg/dL (ref 65–99)
Potassium: 4.1 mmol/L (ref 3.5–5.2)
Sodium: 142 mmol/L (ref 134–144)

## 2019-03-27 LAB — TSH: TSH: 1.07 u[IU]/mL (ref 0.450–4.500)

## 2019-04-01 ENCOUNTER — Telehealth: Payer: Self-pay | Admitting: Internal Medicine

## 2019-04-01 ENCOUNTER — Telehealth: Payer: Self-pay | Admitting: *Deleted

## 2019-04-01 DIAGNOSIS — K219 Gastro-esophageal reflux disease without esophagitis: Secondary | ICD-10-CM

## 2019-04-01 DIAGNOSIS — N3941 Urge incontinence: Secondary | ICD-10-CM

## 2019-04-01 MED ORDER — OXYBUTYNIN CHLORIDE 5 MG PO TABS: 5.0000 mg | ORAL_TABLET | Freq: Three times a day (TID) | ORAL | 2 refills | Status: AC

## 2019-04-01 MED FILL — OXYBUTYNIN 5 MG TABLET: 5 | 30 days supply | Qty: 90 | Fill #0

## 2019-04-01 NOTE — Telephone Encounter (Signed)
Pt called / informed of Oxybutynin refill, sent to [facility] and uncertain of cost.

## 2019-04-01 NOTE — Telephone Encounter (Signed)
I sent a script to [facility] as she requested but it is not on th $4 med list. I am uncertain as to the cost.

## 2019-04-01 NOTE — Telephone Encounter (Signed)
I called to notify the patient of her normal BMP and stable TSH.  There is no answer and voicemail was not left.  There are no medication changes indicated at this time.  Patient is in need of a follow-up visit in 4 to 6 weeks at which time her results can be discussed again if indicated.

## 2019-04-01 NOTE — Telephone Encounter (Signed)
Pt requesting a nurse to call back about her medication.

## 2019-04-01 NOTE — Telephone Encounter (Signed)
Returned pt's call - no answer; left message to call the office.

## 2019-04-01 NOTE — Telephone Encounter (Signed)
Call from pt - informed "her normal BMP and stable TSH.There are no medication changes indicated at this time.  Patient is in need of a follow-up visit in 4 to 6 weeks at which time her results can be discussed again if indicated." per Dr Berline Lopes. She already has an appt 1/4 with her PCP. She stated  Oxybutynin rx was sent to [facility], cost $40.00, which she cannot afford. She wants rx sent to [place]. Thanks

## 2019-04-02 ENCOUNTER — Telehealth (HOSPITAL_COMMUNITY): Payer: Self-pay

## 2019-04-02 NOTE — Telephone Encounter (Signed)
Needs refill on omeprazole (PRILOSEC) 20 MG capsule ;pt contact [place], [place]

## 2019-04-02 NOTE — Telephone Encounter (Signed)
Returned patient's phone call about bill received from visit with Internal Medicine. Informed patient that [facility] Woman will only pay for the visit with the doctor not any additional labs/tests done during the appointment. Informed patient that she would need to fill out the [place] assistance application to have the other stuff covered. Left name and number for patient to call back if she had any other questions.

## 2019-04-02 NOTE — Telephone Encounter (Signed)
Called pt - stated she needs a refill on cholesterol med, Simvastatin. Informed she was changed to Crestor - stated she did not know.

## 2019-04-08 ENCOUNTER — Ambulatory Visit: Payer: No Typology Code available for payment source

## 2019-04-10 ENCOUNTER — Ambulatory Visit: Payer: No Typology Code available for payment source

## 2019-04-29 ENCOUNTER — Encounter: Payer: No Typology Code available for payment source | Admitting: Internal Medicine

## 2019-05-27 MED FILL — METOPROLOL SUCCINATE ER 100: 100 | 30 days supply | Qty: 30 | Fill #1

## 2019-05-27 MED FILL — ROSUVASTATIN CALCIUM 5 MG T: 5 | 30 days supply | Qty: 30 | Fill #1

## 2019-05-27 MED FILL — OMEPRAZOLE DR 20 MG CAPSULE: 20 | 30 days supply | Qty: 30 | Fill #1

## 2019-05-27 MED FILL — AMLODIPINE BESYLATE 5 MG TA: 5 | 30 days supply | Qty: 30 | Fill #1

## 2019-05-27 MED FILL — LEVOTHYROXINE 100 MCG TABLE: 100 | 30 days supply | Qty: 30 | Fill #1

## 2019-05-27 MED FILL — LOSARTAN POTASSIUM 100 MG T: 100 | 30 days supply | Qty: 30 | Fill #1

## 2019-06-17 ENCOUNTER — Telehealth: Payer: Self-pay

## 2019-06-17 NOTE — Telephone Encounter (Signed)
Returned patient's phone call about bills received for [facility] visit. Informed patient that we were able to get the bill corrected for her visit with Internal Medicine. Explained that one of the charges on her [facility] [employer] bill would be covered by Cristela Blue Woman but that she would be responsible for paying the TSH lab work. Patient voiced understanding. Completed [place] HC #3 and scheduled patient for [employer] FU visit.  Health Coaching 3    Goals- Patient has been able to add more fruits and vegetables in diet. Patient has continued to watch the amount of fried and fatty foods that she consumes. Patient has been eating fish at least 2 times per week.   New goal- Increase the amount of whole grains consumed. Gave examples of whole wheat bread, oatmeal, brown rice, whole grain cereals or whole grain pasta.   Barrier to reaching goal- none   Strategies to overcome- NA   Navigation:  Patient is aware of  a follow up session. Patient is scheduled for follow-up visit on July 17, 2018 @ 1:00 pm   Time- 15 minutes

## 2019-06-19 MED FILL — METOPROLOL SUCCINATE ER 100: 100 | 30 days supply | Qty: 30 | Fill #2

## 2019-06-19 MED FILL — ROSUVASTATIN CALCIUM 5 MG T: 5 | 30 days supply | Qty: 30 | Fill #2

## 2019-06-19 MED FILL — LEVOTHYROXINE 100 MCG TABLE: 100 | 30 days supply | Qty: 30 | Fill #2

## 2019-06-19 MED FILL — OMEPRAZOLE DR 20 MG CAPSULE: 20 | 30 days supply | Qty: 30 | Fill #2

## 2019-06-19 MED FILL — LOSARTAN POTASSIUM 100 MG T: 100 | 30 days supply | Qty: 30 | Fill #2

## 2019-06-19 MED FILL — AMLODIPINE BESYLATE 5 MG TA: 5 | 30 days supply | Qty: 30 | Fill #2

## 2019-06-19 NOTE — Telephone Encounter (Signed)
Left voice mail with patient letting her know that we can provide transportation to her follow-up appt. On 3/24. Left name and number for her to call back if she wants me to arrange transportation for her.

## 2019-07-16 MED FILL — OMEPRAZOLE DR 20 MG CAPSULE: 20 | 30 days supply | Qty: 30 | Fill #3

## 2019-07-16 MED FILL — AMLODIPINE BESYLATE 5 MG TA: 5 | 30 days supply | Qty: 30 | Fill #3

## 2019-07-16 MED FILL — LOSARTAN POTASSIUM 100 MG T: 100 | 30 days supply | Qty: 30 | Fill #3

## 2019-07-16 MED FILL — LEVOTHYROXINE 100 MCG TABLE: 100 | 30 days supply | Qty: 30 | Fill #3

## 2019-07-16 MED FILL — ROSUVASTATIN CALCIUM 5 MG T: 5 | 30 days supply | Qty: 30 | Fill #3

## 2019-07-16 MED FILL — METOPROLOL SUCCINATE ER 100: 100 | 30 days supply | Qty: 30 | Fill #3

## 2019-07-17 ENCOUNTER — Ambulatory Visit: Payer: No Typology Code available for payment source

## 2019-07-31 ENCOUNTER — Inpatient Hospital Stay: Payer: Self-pay | Attending: Obstetrics and Gynecology | Admitting: *Deleted

## 2019-07-31 ENCOUNTER — Other Ambulatory Visit: Payer: Self-pay

## 2019-07-31 VITALS — BP 130/90 | Temp 98.0°F | Ht 68.0 in | Wt 186.0 lb

## 2019-07-31 DIAGNOSIS — Z0000 Encounter for general adult medical examination without abnormal findings: Secondary | ICD-10-CM

## 2019-07-31 NOTE — Progress Notes (Signed)
Wisewoman follow up   Clinical Measurement:  Height: 68 in Weight: 186 lb  Blood Pressure: 150/80 Blood Pressure #2: 130/90    Medical History:  Patient states that she does have high cholesterol and blood pressure. Patient does not have a history of diabetes.   Medications:  Patient states that she takes medication to lower cholesterol and blood pressure. Patient does not take any medication blood sugar. Patient does take an aspirin a day to help prevent a heart attack or stroke. During the past 7 days patient has taken prescribed medication to lower cholesterol and blood pressure on 0 days.  Blood pressure, self measurement:  Patient states that she does not measure blood pressure from home and has not been told to do so by a health care provider.    Nutrition:  Patient states that on average she eats 1 cup of fruit and 1 cup of vegetables per day. Patient states that she does eat fish at least 2 times per week. In a typical day patient eats about half servings of whole grains. Patient drinks less than 36 ounces of beverages with added sugar weekly. Patient is currently watching sodium or salt intake. In the past 7 days patient has not had any drinks containing alcohol. On average patient does not drink any drinks containing alcohol.       Physical activity:  Patient states that she gets 105 minutes of moderate and 0 minutes of vigorous physical activity each week.  Smoking status:  Patient states that she has never smoked tobacco.   Quality of life:  Over the past 2 weeks patient states that she has had 0 days where she has little interest or pleasure in doing things and 0 days where she has felt down, depressed or hopeless.   Health Coaching: Patient stated that she has run out of refills on most of her medications including her cholesterol and blood pressure medications. Informed patient of the importance of taking these medications daily. Informed patient that her blood pressure was probably  elevated today because she has not taken her medications in the last 7 days. Informed patient that I would be referring her back to Internal Medicine for follow-up for her elevated BP. Encouraged patient to tell the doctor that she sees that she needs refills on all of her medications. Also encouraged patient to try and add more fruits and vegetables in her daily diet. Explained that the recommendation is for 2 cups of fruit and 3 cups of vegetables daily. Also encouraged patient to continue watching her sodium intake.   Navigation: This was the  follow up session for this patient, I will check up on her progress in the coming months. Will call patient with follow-up appointment information for Internal Medicine for elevated BP once appointment has been scheduled.   Time: 20 minutes

## 2019-08-01 ENCOUNTER — Telehealth: Payer: Self-pay

## 2019-08-01 NOTE — Telephone Encounter (Signed)
Left message with patient about follow-up appointment for elevated BP. Patient is scheduled with Internal Medicine on April 20 @ 2:15 pm. Left name and number for patient to call if she has questions.

## 2019-08-05 NOTE — Progress Notes (Signed)
Patient seen and assessed by nursing staff during this encounter. I have reviewed the chart and agree with the documentation and plan. I have also made any necessary editorial changes.  Mora Bellman, MD 08/05/2019 11:03 AM

## 2019-08-12 MED FILL — ROSUVASTATIN CALCIUM 5 MG T: 5 | 30 days supply | Qty: 30 | Fill #3

## 2019-08-12 MED FILL — AMLODIPINE BESYLATE 5 MG TA: 5 | 30 days supply | Qty: 30 | Fill #3

## 2019-08-12 MED FILL — OMEPRAZOLE DR 20 MG CAPSULE: 20 | 30 days supply | Qty: 30 | Fill #3

## 2019-08-12 MED FILL — LOSARTAN POTASSIUM 100 MG T: 100 | 30 days supply | Qty: 30 | Fill #3

## 2019-08-12 MED FILL — METOPROLOL SUCCINATE ER 100: 100 | 30 days supply | Qty: 30 | Fill #3

## 2019-08-12 MED FILL — LEVOTHYROXINE 100 MCG TABLE: 100 | 30 days supply | Qty: 30 | Fill #3

## 2019-08-13 ENCOUNTER — Other Ambulatory Visit: Payer: Self-pay

## 2019-08-13 ENCOUNTER — Ambulatory Visit (INDEPENDENT_AMBULATORY_CARE_PROVIDER_SITE_OTHER): Payer: Self-pay | Admitting: Internal Medicine

## 2019-08-13 VITALS — BP 164/94 | HR 76 | Temp 97.8°F | Ht 68.0 in | Wt 191.7 lb

## 2019-08-13 DIAGNOSIS — K219 Gastro-esophageal reflux disease without esophagitis: Secondary | ICD-10-CM

## 2019-08-13 DIAGNOSIS — Z79899 Other long term (current) drug therapy: Secondary | ICD-10-CM

## 2019-08-13 DIAGNOSIS — R011 Cardiac murmur, unspecified: Secondary | ICD-10-CM

## 2019-08-13 DIAGNOSIS — Z79890 Hormone replacement therapy (postmenopausal): Secondary | ICD-10-CM

## 2019-08-13 DIAGNOSIS — E782 Mixed hyperlipidemia: Secondary | ICD-10-CM

## 2019-08-13 DIAGNOSIS — E890 Postprocedural hypothyroidism: Secondary | ICD-10-CM

## 2019-08-13 DIAGNOSIS — I10 Essential (primary) hypertension: Secondary | ICD-10-CM

## 2019-08-13 DIAGNOSIS — E785 Hyperlipidemia, unspecified: Secondary | ICD-10-CM

## 2019-08-13 MED ORDER — AMLODIPINE BESYLATE 5 MG PO TABS: 5.0000 mg | ORAL_TABLET | Freq: Every day | ORAL | 3 refills | Status: DC

## 2019-08-13 MED ORDER — OMEPRAZOLE 20 MG PO CPDR: 20.0000 mg | DELAYED_RELEASE_CAPSULE | Freq: Every day | ORAL | 1 refills | Status: DC

## 2019-08-13 MED ORDER — LEVOTHYROXINE SODIUM 100 MCG PO TABS: 100.0000 ug | ORAL_TABLET | Freq: Every day | ORAL | 2 refills | Status: DC

## 2019-08-13 MED ORDER — LOSARTAN POTASSIUM 100 MG PO TABS: 100.0000 mg | ORAL_TABLET | Freq: Every day | ORAL | 3 refills | Status: DC

## 2019-08-13 MED ORDER — METOPROLOL SUCCINATE ER 100 MG PO TB24: 100.0000 mg | ORAL_TABLET | Freq: Every day | ORAL | 3 refills | Status: DC

## 2019-08-13 MED ORDER — ATORVASTATIN CALCIUM 20 MG PO TABS: 20.0000 mg | ORAL_TABLET | Freq: Every day | ORAL | 3 refills | Status: DC

## 2019-08-13 NOTE — Progress Notes (Signed)
Internal Medicine Clinic Attending  Case discussed with Dr. Winfrey  at the time of the visit.  We reviewed the resident's history and exam and pertinent patient test results.  I agree with the assessment, diagnosis, and plan of care documented in the resident's note.  

## 2019-08-13 NOTE — Assessment & Plan Note (Signed)
Pt requires refills on medications with associated diagnosis above.  Reviewed disease process and find this medication to be necessary, will not change dose or alter current therapy. 

## 2019-08-13 NOTE — Assessment & Plan Note (Signed)
Pt has been out of synthroid 132mcg for 3-4 days.  Last TSH appropriate 4 months ago.  -refill syntrhoid at [facility], no changes in dose

## 2019-08-13 NOTE — Progress Notes (Signed)
CC: HTN, Hypothyroidism follow up  HPI:  [name] is a 64 y.o. female with PMH below.  Today we will address HTN, Hypothyroidism follow up  Please see A&P for status of the patient's chronic medical conditions  Past Medical History:  Diagnosis Date  . Cancer (HCC)    Breast  . Dyslipidemia   . History of DVT (deep vein thrombosis)   . History of papillary adenocarcinoma of thyroid    Stage 1  . Hypertension   . Hypothyroidism   . Lung disease   . S/P thyroidectomy 11/2005   Suspicious nodules, path- 2 foci left lobe (10 &106mm) I-131 Rx 02/2006, neg thyrogen scan 10/2006, thyroglobulin  0.7 neg antibody 11/2006 07/2007 01/2008 07/2008 07/2009  . Surgical hypoparathyroidism ([place])   . Urinary incontinence   . Urolithiasis    Review of Systems:  ROS: Pulmonary: pt denies increased work of breathing, shortness of breath,  Cardiac: pt denies palpitations, chest pain,  Abdominal: pt denies abdominal pain, nausea, vomiting, or diarrhea   Physical Exam:  Vitals:   08/13/19 1403  BP: (!) 164/94  Pulse: 76  Temp: 97.8 F (36.6 C)  TempSrc: Oral  SpO2: 100%  Weight: 191 lb 11.2 oz (87 kg)  Height: 5\' 8"  (1.727 m)   Cardiac:  normal rate and rhythm, clear s1 and s2, 2/6 systolic murmur, no rubs or gallops Pulmonary: CTAB, not in distress Psych: Alert, conversant, in good spirits   Social History   Socioeconomic History  . Marital status: Single    Spouse name: Not on file  . Number of children: 0  . Years of education: HS  . Highest education level: Not on file  Occupational History    Employer: UNEMPLOYED  Tobacco Use  . Smoking status: Never Smoker  . Smokeless tobacco: Never Used  Substance and Sexual Activity  . Alcohol use: No  . Drug use: No  . Sexual activity: Not Currently    Partners: Male    Birth control/protection: Post-menopausal  Other Topics Concern  . Not on file  Social History Narrative   Lives at [place], [place])   Patient lives at home with her mother 07/19/13   Caffeine Use: 3-4 cups daily   Social Determinants of Health   Financial Resource Strain:   . Difficulty of Paying Living Expenses:   Food Insecurity:   . Worried About Charity fundraiser in the Last Year:   . Arboriculturist in the Last Year:   Transportation Needs: Unmet Transportation Needs  . Lack of Transportation (Medical): Yes  . Lack of Transportation (Non-Medical): Yes  Physical Activity:   . Days of Exercise per Week:   . Minutes of Exercise per Session:   Stress:   . Feeling of Stress :   Social Connections:   . Frequency of Communication with Friends and Family:   . Frequency of Social Gatherings with Friends and Family:   . Attends Religious Services:   . Active Member of Clubs or Organizations:   . Attends Archivist Meetings:   Marland Kitchen Marital Status:   Intimate Partner Violence:   . Fear of Current or Ex-Partner:   . Emotionally Abused:   Marland Kitchen Physically Abused:   . Sexually Abused:     Family History  Problem Relation Age of Onset  . Stroke Father   . Stroke Maternal Grandmother   . Stroke Maternal Grandfather   . Stroke Paternal Grandmother   . Stroke Paternal  Grandfather     Assessment & Plan:   See Encounters Tab for problem based charting.  Patient discussed with Dr. Lynnae January

## 2019-08-13 NOTE — Patient Instructions (Signed)
Amanda Meyers, we have reordered your medications today.  You will need to be on your medicines regularly until you come back to the office next time so we can see how your blood pressure and thyroid function is doing on your medicine.

## 2019-08-13 NOTE — Assessment & Plan Note (Addendum)
Hypertensive today, pt has been out of bp meds for a few weeks.  She has been on amlodipine 5, metoprolol xl 100, and losartan 100mg  for around 15 years she says.  No side effects or issues.  Recent labs available and appropriate.  -continue above regimen, will prescribe to [facility].   -consulted ccm for additional options

## 2019-08-28 ENCOUNTER — Other Ambulatory Visit: Payer: Self-pay

## 2019-08-28 DIAGNOSIS — E890 Postprocedural hypothyroidism: Secondary | ICD-10-CM

## 2019-08-28 NOTE — Telephone Encounter (Signed)
levothyroxine (SYNTHROID) 100 MCG tablet, is not at the pharmacy. Pt is using  [facility]. - [place] [address], [place] [phone] (Phone) [phone] (Fax)

## 2019-08-29 NOTE — Telephone Encounter (Signed)
Patient called in asking for levothyroxine. Explained this was sent to [facility] on 08/13/2019. States she lives in [place] and requests it be sent to [facility] in [place]. Confirmed she does NOT want this at [facility] as she requested yesterday as she can get it cheaper at [facility]. Rx canceled at [facility] and called to [facility]. Hubbard Hartshorn, BSN, RN-BC

## 2019-08-29 NOTE — Telephone Encounter (Signed)
I apologize at the time I was under the impression it would be more expensive at [facility].  Thanks for your help.

## 2019-09-05 ENCOUNTER — Ambulatory Visit: Payer: Self-pay

## 2019-09-05 NOTE — Chronic Care Management (AMB) (Signed)
  Chronic Care Management   Outreach Note  09/05/2019 Name: Amanda Meyers MRN: [id number] DOB: Apr 01, 1956  Referred by: Marianna Payment, MD Reason for referral : Care Coordination (Medication assistance)   An unsuccessful telephone outreach was attempted today. The patient was referred to the case management team for assistance with care management and care coordination.   Follow Up Plan: A HIPPA compliant phone message was left for the patient providing contact information and requesting a return call.      Ronn Melena, [place] Coordination Social Worker [place] [phone]

## 2019-09-10 ENCOUNTER — Ambulatory Visit: Payer: Self-pay

## 2019-09-10 NOTE — Chronic Care Management (AMB) (Signed)
  Chronic Care Management   Outreach Note  09/10/2019 Name: Amanda Meyers MRN: [id number] DOB: 26-Mar-1956  Referred by: Marianna Payment, MD Reason for referral : Care Coordination (Medication Assistance)   A second unsuccessful telephone outreach was attempted today. The patient was referred to the case management team for assistance with care management and care coordination.   Follow Up Plan: A HIPPA compliant phone message was left for the patient providing contact information and requesting a return call.        Ronn Melena, [place] Coordination Social Worker [place] [phone]

## 2019-09-24 ENCOUNTER — Other Ambulatory Visit: Payer: Self-pay | Admitting: Internal Medicine

## 2019-09-24 DIAGNOSIS — E890 Postprocedural hypothyroidism: Secondary | ICD-10-CM

## 2019-09-24 NOTE — Telephone Encounter (Signed)
Need refill thyroid medicine [phone] levothyroxine (SYNTHROID) 100 MCG tablet  [facility] [address], [place] - [place]

## 2019-09-24 NOTE — Telephone Encounter (Signed)
Pls contact pt regarding medicine [phone]

## 2019-09-24 NOTE — Telephone Encounter (Signed)
Refill Request  levothyroxine (SYNTHROID) 100 MCG tablet  [facility] - [place], [place] - [address].

## 2019-09-24 NOTE — Telephone Encounter (Signed)
Called pt no answer °

## 2019-09-25 ENCOUNTER — Other Ambulatory Visit: Payer: Self-pay | Admitting: Internal Medicine

## 2019-09-25 DIAGNOSIS — E890 Postprocedural hypothyroidism: Secondary | ICD-10-CM

## 2019-09-25 NOTE — Telephone Encounter (Signed)
Pls contact pt [phone] regarding medicine

## 2019-09-26 ENCOUNTER — Ambulatory Visit: Payer: No Typology Code available for payment source

## 2019-09-26 ENCOUNTER — Ambulatory Visit: Payer: Self-pay

## 2019-09-26 ENCOUNTER — Encounter: Payer: No Typology Code available for payment source | Admitting: Internal Medicine

## 2019-09-26 NOTE — Chronic Care Management (AMB) (Signed)
°  Care Management   Social Work Note  09/26/2019 Name: Amanda Meyers MRN: [id number] DOB: 01-12-1956  Amanda Meyers is a 64 y.o. year old female who sees Marianna Payment, MD for primary care. The Care Management team was consulted for assistance.     Have been unable to contact patient via phone.  Patient was scheduled for office visit today but appointment was cancelled.    The care management team is available to follow up with the patient after provider conversation with the patient regarding recommendation for care management engagement and subsequent re-referral to the care management team.     Ronn Melena, [place] Worker [place] [phone]

## 2019-09-26 NOTE — Progress Notes (Signed)
Internal Medicine Clinic Resident  I have personally reviewed this encounter including the documentation in this note and/or discussed this patient with the care management provider. I will address any urgent items identified by the care management provider and will communicate my actions to the patient's PCP. I have reviewed the patient's CCM visit with my supervising attending, Dr Butcher.  Lambert Jeanty, MD 09/26/2019    

## 2019-09-27 NOTE — Progress Notes (Signed)
Internal Medicine Clinic Attending  CCM services provided by the care management provider and their documentation were discussed with Dr. Sheppard Coil. We reviewed the pertinent findings, urgent action items addressed by the resident and non-urgent items to be addressed by the PCP.  I agree with the assessment, diagnosis, and plan of care documented in the CCM and resident's note.  Larey Dresser, MD 09/27/2019

## 2019-10-02 ENCOUNTER — Telehealth: Payer: Self-pay

## 2019-10-02 NOTE — Telephone Encounter (Signed)
Returned patient's phone call about bill she received for doctor's visit. Left message for patient asking her to send [place] a copy of the bill for [place] to look at to make sure we can cover it. Left address and fax number of where she can send it to. Left name and number for her to call back if she has any other questions.

## 2019-11-11 ENCOUNTER — Other Ambulatory Visit: Payer: Self-pay | Admitting: Internal Medicine

## 2019-11-11 ENCOUNTER — Telehealth: Payer: Self-pay

## 2019-11-11 ENCOUNTER — Telehealth: Payer: Self-pay | Admitting: *Deleted

## 2019-11-11 DIAGNOSIS — E890 Postprocedural hypothyroidism: Secondary | ICD-10-CM

## 2019-11-11 MED ORDER — LEVOTHYROXINE SODIUM 100 MCG PO TABS: 100.0000 ug | ORAL_TABLET | Freq: Every day | ORAL | 11 refills | Status: DC

## 2019-11-11 MED ORDER — LEVOTHYROXINE SODIUM 100 MCG PO TABS: 100.0000 ug | ORAL_TABLET | Freq: Every morning | ORAL | 0 refills | Status: DC

## 2019-11-11 NOTE — Telephone Encounter (Signed)
Prescription is in.

## 2019-11-11 NOTE — Telephone Encounter (Signed)
Patient returned phone call. Patient was calling about the new thyroid medication she was switched to. Patient has had some swelling and pain in legs that she feels is related to the new medication. Informed patient that she would need to contact Internal Medicine since they are the ones that prescribed her the medication. Informed the patient if they wanted to see her back in the office that Cristela [facility] Woman would not cover the visit and she would be responsible for the bill. Patient voiced understanding.

## 2019-11-11 NOTE — Telephone Encounter (Signed)
Patient called in stating she can no longer take the Euthyrox that was sent to [facility] on 09/25/2018. C/o bilat feet pain, red skin that is "splitting" since beginning this. Encouraged patient to come to clinic to be seen for this, however, she does not have insurance and is refusing appt. Requests returning to plain levothyroxine. Spoke with Otila Kluver at [facility] in [place]. States it is difficult for them to get levothyroxine and that is why she was switched to Euthyrox. Otila Kluver will order levothyroxine and put Euthyrox on allergy list. Otila Kluver requests a new Rx for levothyroxine as last one did not have refills.

## 2019-11-11 NOTE — Telephone Encounter (Signed)
Call from pt - stated she thought the office had called her back. Informed she had talked to Greggory Keen has talked to [facility] who will order Levothyroxine and needs a new rx from he doctor.

## 2019-11-11 NOTE — Telephone Encounter (Signed)
Returned patient's phone call. Left name and number for patient to call back.

## 2020-01-13 ENCOUNTER — Other Ambulatory Visit: Payer: Self-pay

## 2020-01-13 DIAGNOSIS — Z1231 Encounter for screening mammogram for malignant neoplasm of breast: Secondary | ICD-10-CM

## 2020-02-20 ENCOUNTER — Emergency Department (HOSPITAL_BASED_OUTPATIENT_CLINIC_OR_DEPARTMENT_OTHER): Payer: No Typology Code available for payment source

## 2020-02-20 ENCOUNTER — Emergency Department (HOSPITAL_COMMUNITY): Payer: No Typology Code available for payment source

## 2020-02-20 ENCOUNTER — Other Ambulatory Visit: Payer: Self-pay

## 2020-02-20 ENCOUNTER — Emergency Department (HOSPITAL_COMMUNITY)
Admission: EM | Admit: 2020-02-20 | Discharge: 2020-02-20 | Disposition: A | Discharge status: Home / Self Care | Payer: No Typology Code available for payment source | Attending: Emergency Medicine | Admitting: Emergency Medicine

## 2020-02-20 ENCOUNTER — Ambulatory Visit: Payer: Self-pay | Admitting: *Deleted

## 2020-02-20 ENCOUNTER — Ambulatory Visit: Payer: No Typology Code available for payment source

## 2020-02-20 ENCOUNTER — Encounter (HOSPITAL_COMMUNITY): Payer: Self-pay | Admitting: Emergency Medicine

## 2020-02-20 VITALS — BP 132/82 | HR 80 | Temp 97.1°F | Wt 240.6 lb

## 2020-02-20 DIAGNOSIS — Z1239 Encounter for other screening for malignant neoplasm of breast: Secondary | ICD-10-CM

## 2020-02-20 DIAGNOSIS — N644 Mastodynia: Secondary | ICD-10-CM

## 2020-02-20 DIAGNOSIS — Z7982 Long term (current) use of aspirin: Secondary | ICD-10-CM | POA: Insufficient documentation

## 2020-02-20 DIAGNOSIS — I10 Essential (primary) hypertension: Secondary | ICD-10-CM | POA: Insufficient documentation

## 2020-02-20 DIAGNOSIS — M79672 Pain in left foot: Secondary | ICD-10-CM | POA: Insufficient documentation

## 2020-02-20 DIAGNOSIS — Z79899 Other long term (current) drug therapy: Secondary | ICD-10-CM | POA: Insufficient documentation

## 2020-02-20 DIAGNOSIS — Z853 Personal history of malignant neoplasm of breast: Secondary | ICD-10-CM | POA: Insufficient documentation

## 2020-02-20 DIAGNOSIS — M7989 Other specified soft tissue disorders: Secondary | ICD-10-CM

## 2020-02-20 DIAGNOSIS — R609 Edema, unspecified: Secondary | ICD-10-CM

## 2020-02-20 DIAGNOSIS — L0100 Impetigo, unspecified: Secondary | ICD-10-CM | POA: Insufficient documentation

## 2020-02-20 DIAGNOSIS — Z1211 Encounter for screening for malignant neoplasm of colon: Secondary | ICD-10-CM

## 2020-02-20 DIAGNOSIS — R5383 Other fatigue: Secondary | ICD-10-CM | POA: Insufficient documentation

## 2020-02-20 DIAGNOSIS — E039 Hypothyroidism, unspecified: Secondary | ICD-10-CM | POA: Insufficient documentation

## 2020-02-20 LAB — COMPREHENSIVE METABOLIC PANEL
ALT: 19 U/L (ref 0–44)
AST: 24 U/L (ref 15–41)
Albumin: 3.7 g/dL (ref 3.5–5.0)
Alkaline Phosphatase: 93 U/L (ref 38–126)
Anion gap: 14 (ref 5–15)
BUN: 26 mg/dL — ABNORMAL HIGH (ref 8–23)
CO2: 29 mmol/L (ref 22–32)
Calcium: 7.5 mg/dL — ABNORMAL LOW (ref 8.9–10.3)
Chloride: 99 mmol/L (ref 98–111)
Creatinine, Ser: 1.56 mg/dL — ABNORMAL HIGH (ref 0.44–1.00)
GFR, Estimated: 37 mL/min — ABNORMAL LOW (ref >60)
Glucose, Bld: 96 mg/dL (ref 70–99)
Potassium: 3.5 mmol/L (ref 3.5–5.1)
Sodium: 142 mmol/L (ref 135–145)
Total Bilirubin: 0.5 mg/dL (ref 0.3–1.2)
Total Protein: 7.4 g/dL (ref 6.5–8.1)

## 2020-02-20 LAB — CBC WITH DIFFERENTIAL/PLATELET
Abs Immature Granulocytes: 0.02 10*3/uL (ref 0.00–0.07)
Basophils Absolute: 0.1 10*3/uL (ref 0.0–0.1)
Basophils Relative: 1 %
Eosinophils Absolute: 0.2 10*3/uL (ref 0.0–0.5)
Eosinophils Relative: 3 %
HCT: 45.6 % (ref 36.0–46.0)
Hemoglobin: 14.2 g/dL (ref 12.0–15.0)
Immature Granulocytes: 0 %
Lymphocytes Relative: 18 %
Lymphs Abs: 1.1 10*3/uL (ref 0.7–4.0)
MCH: 30.2 pg (ref 26.0–34.0)
MCHC: 31.1 g/dL (ref 30.0–36.0)
MCV: 97 fL (ref 80.0–100.0)
Monocytes Absolute: 0.7 10*3/uL (ref 0.1–1.0)
Monocytes Relative: 11 %
Neutro Abs: 4.1 10*3/uL (ref 1.7–7.7)
Neutrophils Relative %: 67 %
Platelets: 323 10*3/uL (ref 150–400)
RBC: 4.7 MIL/uL (ref 3.87–5.11)
RDW: 14.6 % (ref 11.5–15.5)
WBC: 6.2 10*3/uL (ref 4.0–10.5)
nRBC: 0 % (ref 0.0–0.2)

## 2020-02-20 LAB — D-DIMER, QUANTITATIVE: D-Dimer, Quant: 1.52 ug/mL-FEU — ABNORMAL HIGH (ref 0.00–0.50)

## 2020-02-20 LAB — BRAIN NATRIURETIC PEPTIDE: B Natriuretic Peptide: 53.5 pg/mL (ref 0.0–100.0)

## 2020-02-20 MED ORDER — DOXYCYCLINE HYCLATE 100 MG PO TABS
100.0000 mg | ORAL_TABLET | Freq: Once | ORAL | Status: AC
  Administered 2020-02-20: 100 mg via ORAL
  Filled 2020-02-20: qty 1

## 2020-02-20 MED ORDER — MUPIROCIN CALCIUM 2 % EX CREA: 1.0000 "application " | TOPICAL_CREAM | Freq: Two times a day (BID) | CUTANEOUS | 0 refills | Status: AC

## 2020-02-20 MED ORDER — IOHEXOL 350 MG/ML SOLN
75.0000 mL | Freq: Once | INTRAVENOUS | Status: AC | PRN
  Administered 2020-02-20: 75 mL via INTRAVENOUS

## 2020-02-20 MED ORDER — DOXYCYCLINE HYCLATE 100 MG PO CAPS: 100.0000 mg | ORAL_CAPSULE | Freq: Two times a day (BID) | ORAL | 0 refills | Status: AC

## 2020-02-20 MED ORDER — LACTATED RINGERS IV BOLUS
1000.0000 mL | Freq: Once | INTRAVENOUS | Status: AC
  Administered 2020-02-20: 1000 mL via INTRAVENOUS

## 2020-02-20 NOTE — ED Notes (Signed)
Pt discharged to lobby while she awaits [facility]. Lobby Staff made aware.

## 2020-02-20 NOTE — ED Triage Notes (Signed)
Pt c/o bilateral leg pain and swollen, possible infection.

## 2020-02-20 NOTE — Patient Instructions (Addendum)
Explained breast self awareness with Amanda Meyers. Patient did not need a Pap smear today due to her history of a hysterectomy for benign reasons. Let patient know that she doesn't need any further Pap smears due to her history of a hysterectomy for benign reasons. [facility]. Appointment scheduled Thursday, March 05, 2020 at 1310.Patient aware of appointment and will be there. [name] verbalized understanding.  [name], [place], RN 2:16 PM

## 2020-02-20 NOTE — Discharge Instructions (Addendum)
.  npapatientinstructions  ?[name]:  Thank you for allowing [place] to take care of you today.  We hope you begin feeling better soon.  To-Do: Please follow-up with your primary doctor or call above to schedule an appointment with a new primary care doctor Hold losartan until follow up, Drink plenty of fluids, Avoid NSAIDs Please use Mupirocin topically on your feet as instructed- also it is important to clean your feet well before applying, scrub feet with warm soap and water, and dry very well  Please take doxycycline twice daily for 7 days Please return to the Emergency Department or call 911 if you experience chest pain, shortness of breath, severe pain, severe fever, altered mental status, or have any reason to think that you need emergency medical care.  Thank you again.  Hope you feel better soon.

## 2020-02-20 NOTE — ED Notes (Signed)
Pt transported to CT ?

## 2020-02-20 NOTE — Social Work (Signed)
CSW utilized Public librarian to connect Pt with transportation to her home in [place].

## 2020-02-20 NOTE — ED Notes (Signed)
Patient verbalizes understanding of discharge instructions. Opportunity for questioning and answers were provided. Pt is now awaiting a social work consult to determine means of obtaining medications.

## 2020-02-20 NOTE — ED Provider Notes (Signed)
[facility] EMERGENCY DEPARTMENT Provider Note   CSN: [id number] Arrival date & time: 02/20/20  1515     History Chief Complaint  Patient presents with  . Leg Pain    Amanda Meyers is a 64 y.o. female.  The history is provided by the patient.  Leg Pain Location:  Foot Injury: no   Foot location:  L foot and R foot Pain details:    Quality:  Unable to specify Associated symptoms: fatigue   Associated symptoms: no fever   Rash Location:  Foot Foot rash location:  Top of L foot and top of R foot Quality: draining, dryness, peeling, redness, swelling and weeping   Severity:  Mild Onset quality:  Gradual Duration:  3 weeks Timing:  Constant Progression:  Worsening Chronicity:  New Relieved by:  Nothing Associated symptoms: fatigue   Associated symptoms: no abdominal pain, no diarrhea, no fever, no headaches, no joint pain, no nausea, no shortness of breath, no sore throat, no URI, not vomiting and not wheezing        Past Medical History:  Diagnosis Date  . Cancer (HCC)    Breast  . Dyslipidemia   . History of DVT (deep vein thrombosis)   . History of papillary adenocarcinoma of thyroid    Stage 1  . Hypertension   . Hypothyroidism   . Lung disease   . S/P thyroidectomy 11/2005   Suspicious nodules, path- 2 foci left lobe (10 &90mm) I-131 Rx 02/2006, neg thyrogen scan 10/2006, thyroglobulin  0.7 neg antibody 11/2006 07/2007 01/2008 07/2008 07/2009  . Surgical hypoparathyroidism ([place])   . Urinary incontinence   . Urolithiasis     Patient Active Problem List   Diagnosis Date Noted  . HLD (hyperlipidemia) 03/26/2019  . Gastroesophageal reflux disease 03/26/2019  . Screening breast examination 02/21/2019  . Osteoporosis 01/19/2015  . HYPOTHYROIDISM, POSTSURGICAL 08/01/2007  . CARCINOMA, THYROID GLAND, PAPILLARY 01/05/2007  . HYPOPARATHYROIDISM 01/05/2007  . Essential hypertension 01/04/2007  . Urge incontinence of urine 01/04/2007    . BREAST CANCER, HX OF 01/04/2007  . DVT, HX OF 01/04/2007    Past Surgical History:  Procedure Laterality Date  . AUGMENTATION MAMMAPLASTY Bilateral   . MASTECTOMY Left 2002  . THYROIDECTOMY  2007     OB History    Gravida  0   Para  0   Term  0   Preterm  0   AB  0   Living  0     SAB  0   TAB  0   Ectopic  0   Multiple  0   Live Births  0           Family History  Problem Relation Age of Onset  . Stroke Father   . Stroke Maternal Grandmother   . Stroke Maternal Grandfather   . Stroke Paternal Grandmother   . Stroke Paternal Grandfather     Social History   Tobacco Use  . Smoking status: Never Smoker  . Smokeless tobacco: Never Used  Vaping Use  . Vaping Use: Never used  Substance Use Topics  . Alcohol use: No  . Drug use: No    Home Medications Prior to Admission medications   Medication Sig Start Date End Date Taking? Authorizing Provider  aspirin 81 MG EC tablet Take 81 mg by mouth daily.     Yes [provider]  Dextran 70-Hypromellose, PF, (ARTIFICIAL TEARS PF) 0.1-0.3 % SOLN Place 1-2 drops into both eyes  daily as needed (for dryness).    Yes [provider]  levothyroxine (SYNTHROID) 100 MCG tablet Take 1 tablet (100 mcg total) by mouth daily. 11/11/19 11/10/20 Yes Marianna Payment, MD  metoprolol succinate (TOPROL-XL) 100 MG 24 hr tablet Take 100 mg by mouth daily. Take with or immediately following a meal.   Yes [provider]  amLODipine (NORVASC) 5 MG tablet Take 1 tablet (5 mg total) by mouth daily. 08/13/19   Katherine Roan, MD  atorvastatin (LIPITOR) 20 MG tablet Take 1 tablet (20 mg total) by mouth daily. Patient not taking: Reported on 02/20/2020 08/13/19   Katherine Roan, MD  doxycycline (VIBRAMYCIN) 100 MG capsule Take 1 capsule (100 mg total) by mouth 2 (two) times daily for 7 days. 02/20/20 02/27/20  Roosevelt Locks, MD  fluorometholone (FML) 0.1 % ophthalmic suspension Place 1 drop into both  eyes every 4 (four) hours.    [provider]  losartan (COZAAR) 100 MG tablet Take 1 tablet (100 mg total) by mouth daily. 08/13/19   Katherine Roan, MD  metoprolol succinate (TOPROL-XL) 100 MG 24 hr tablet Take 1 tablet (100 mg total) by mouth daily. Patient not taking: Reported on 02/20/2020 08/13/19   Katherine Roan, MD  mupirocin cream (BACTROBAN) 2 % Apply 1 application topically 2 (two) times daily for 10 days. 02/20/20 03/01/20  Roosevelt Locks, MD  omeprazole (PRILOSEC) 20 MG capsule Take 1 capsule (20 mg total) by mouth daily. Patient not taking: Reported on 02/20/2020 03/26/19   Kathi Ludwig, MD  omeprazole (PRILOSEC) 20 MG capsule Take 1 capsule (20 mg total) by mouth daily. 08/13/19   Katherine Roan, MD  oxybutynin (DITROPAN) 5 MG tablet Take 1 tablet (5 mg total) by mouth 3 (three) times daily. 04/01/19   Kathi Ludwig, MD  rosuvastatin (CRESTOR) 5 MG tablet Take 5 mg by mouth daily.    [provider]    Allergies    Euthyrox [levothyroxine] and Gabapentin  Review of Systems   Review of Systems  Constitutional: Positive for fatigue. Negative for chills and fever.  HENT: Negative for congestion and sore throat.   Respiratory: Negative for shortness of breath and wheezing.   Cardiovascular: Positive for leg swelling. Negative for chest pain.  Gastrointestinal: Negative for abdominal pain, diarrhea, nausea and vomiting.  Musculoskeletal: Negative for arthralgias.  Skin: Positive for rash.  Neurological: Negative for headaches.  All other systems reviewed and are negative.   Physical Exam Updated Vital Signs BP (!) 158/71 (BP Location: Right Arm)   Pulse 95   Temp 98.3 F (36.8 C) (Oral)   Resp 16   Ht 5\' 8"  (1.727 m)   Wt 109.1 kg   SpO2 100%   BMI 36.57 kg/m   Physical Exam Vitals reviewed.  Constitutional:      General: She is not in acute distress.    Appearance: Normal appearance.  HENT:     Head: Normocephalic and  atraumatic.     Nose: Nose normal.     Mouth/Throat:     Mouth: Mucous membranes are moist.  Eyes:     Conjunctiva/sclera: Conjunctivae normal.  Cardiovascular:     Heart sounds: Normal heart sounds.  Pulmonary:     Effort: Pulmonary effort is normal.     Breath sounds: Normal breath sounds.  Abdominal:     General: Abdomen is flat.     Palpations: Abdomen is soft.     Tenderness: There is no abdominal tenderness.  Musculoskeletal:  Cervical back: Neck supple.     Right lower leg: Edema present.     Left lower leg: Edema present.     Comments: severely swollen BLLE with 3+ pitting edema   Skin:    General: Skin is warm and dry.     Findings: Erythema and rash present.     Comments: Erythema and swelling in Bilateral anterior calves Erythema, yellow crusting, and weeping on tops of bilateral feet   Neurological:     Mental Status: She is alert.  Psychiatric:        Mood and Affect: Mood normal.        Behavior: Behavior normal.     ED Results / Procedures / Treatments   Labs (all labs ordered are listed, but only abnormal results are displayed) Labs Reviewed  COMPREHENSIVE METABOLIC PANEL - Abnormal; Notable for the following components:      Result Value   BUN 26 (*)    Creatinine, Ser 1.56 (*)    Calcium 7.5 (*)    GFR, Estimated 37 (*)    All other components within normal limits  D-DIMER, QUANTITATIVE (NOT AT [facility]) - Abnormal; Notable for the following components:   D-Dimer, Quant 1.52 (*)    All other components within normal limits  CBC WITH DIFFERENTIAL/PLATELET  BRAIN NATRIURETIC PEPTIDE    EKG None  Radiology CT Angio Chest PE W/Cm &/Or Wo Cm  Result Date: 02/20/2020 CLINICAL DATA:  Elevated D-dimer. EXAM: CT ANGIOGRAPHY CHEST WITH CONTRAST TECHNIQUE: Multidetector CT imaging of the chest was performed using the standard protocol during bolus administration of intravenous contrast. Multiplanar CT image reconstructions and MIPs were obtained to  evaluate the vascular anatomy. CONTRAST:  89mL OMNIPAQUE IOHEXOL 350 MG/ML SOLN COMPARISON:  May 16, 2003 FINDINGS: Cardiovascular: There is mild calcification of the aortic arch. Satisfactory opacification of the pulmonary arteries to the segmental level. No evidence of pulmonary embolism. Normal heart size. No pericardial effusion. Mediastinum/Nodes: No enlarged mediastinal, hilar, or axillary lymph nodes. Thyroid gland, trachea, and esophagus demonstrate no significant findings. Lungs/Pleura: Lungs are clear. No pleural effusion or pneumothorax. Upper Abdomen: A subcentimeter gallstone is seen within the lumen of an otherwise normal-appearing gallbladder. A 3.4 cm x 2.1 cm low-attenuation right adrenal mass is seen. 2.6 cm x 2.5 cm and 1.5 cm x 0.8 cm exophytic cysts are seen along the posterior aspect of the mid left kidney. Musculoskeletal: Bilateral breast implants are seen. Review of the MIP images confirms the above findings. IMPRESSION: 1. No CT evidence of pulmonary embolism. 2. Cholelithiasis. 3. Low-attenuation right adrenal mass which may represent an adrenal adenoma. 4. Left renal cysts. 5. Aortic atherosclerosis. Aortic Atherosclerosis (ICD10-I70.0). Electronically Signed   By: Virgina Norfolk M.D.   On: 02/20/2020 20:45   DG Chest Portable 1 View  Result Date: 02/20/2020 CLINICAL DATA:  64 year old female with leg swelling. EXAM: PORTABLE CHEST 1 VIEW COMPARISON:  Chest radiograph dated 11/26/2009. FINDINGS: No focal consolidation, pleural effusion, or pneumothorax. A 10 mm nodular density over the heart may be artifactual. This can be better evaluated with CT on a nonemergent/outpatient basis if clinically indicated. The cardiac silhouette is within limits. Atherosclerotic calcification of the aorta. No acute osseous pathology. IMPRESSION: No active disease. Electronically Signed   By: Anner Crete M.D.   On: 02/20/2020 18:43   VAS [place] LOWER EXTREMITY VENOUS (DVT) ([facility] and [facility]  7a-7p)  Result Date: 02/20/2020  Lower Venous DVTStudy Indications: Swelling, and Edema.  Limitations: Body habitus and poor ultrasound/tissue  interface. Comparison Study: no prior Performing Technologist: Abram Sander RVS  Examination Guidelines: A complete evaluation includes B-mode imaging, spectral Doppler, color Doppler, and power Doppler as needed of all accessible portions of each vessel. Bilateral testing is considered an integral part of a complete examination. Limited examinations for reoccurring indications may be performed as noted. The reflux portion of the exam is performed with the patient in reverse Trendelenburg.  +---------+---------------+---------+-----------+----------+--------------+ RIGHT    CompressibilityPhasicitySpontaneityPropertiesThrombus Aging +---------+---------------+---------+-----------+----------+--------------+ CFV      Full           Yes      Yes                                 +---------+---------------+---------+-----------+----------+--------------+ SFJ      Full                                                        +---------+---------------+---------+-----------+----------+--------------+ FV Prox  Full                                                        +---------+---------------+---------+-----------+----------+--------------+ FV Mid   Full                                                        +---------+---------------+---------+-----------+----------+--------------+ FV Distal               Yes      Yes                                 +---------+---------------+---------+-----------+----------+--------------+ PFV      Full                                                        +---------+---------------+---------+-----------+----------+--------------+ POP      Full           Yes      Yes                                 +---------+---------------+---------+-----------+----------+--------------+ PTV                                                    Not visualized +---------+---------------+---------+-----------+----------+--------------+ PERO  Not visualized +---------+---------------+---------+-----------+----------+--------------+   +---------+---------------+---------+-----------+----------+------------------+ LEFT     CompressibilityPhasicitySpontaneityPropertiesThrombus Aging     +---------+---------------+---------+-----------+----------+------------------+ CFV      Full           Yes      Yes                                     +---------+---------------+---------+-----------+----------+------------------+ SFJ      Full                                                            +---------+---------------+---------+-----------+----------+------------------+ FV Prox  Full                                                            +---------+---------------+---------+-----------+----------+------------------+ FV Mid                  Yes      Yes                                     +---------+---------------+---------+-----------+----------+------------------+ FV Distal               Yes      Yes                                     +---------+---------------+---------+-----------+----------+------------------+ PFV      Full                                                            +---------+---------------+---------+-----------+----------+------------------+ POP      Full           Yes      Yes                  limited                                                                  visualization      +---------+---------------+---------+-----------+----------+------------------+ PTV                                                   Not visualized     +---------+---------------+---------+-----------+----------+------------------+ PERO  Not visualized     +---------+---------------+---------+-----------+----------+------------------+     Summary: RIGHT: - There is no evidence of deep vein thrombosis in the lower extremity. However, portions of this examination were limited- see technologist comments above.  - No cystic structure found in the popliteal fossa.  LEFT: - There is no evidence of deep vein thrombosis in the lower extremity. However, portions of this examination were limited- see technologist comments above.  - No cystic structure found in the popliteal fossa.  *See table(s) above for measurements and observations.    Preliminary     Procedures Procedures (including critical care time)  Medications Ordered in ED Medications  lactated ringers bolus 1,000 mL (0 mLs Intravenous Stopped 02/20/20 2137)  iohexol (OMNIPAQUE) 350 MG/ML injection 75 mL (75 mLs Intravenous Contrast Given 02/20/20 2023)  doxycycline (VIBRA-TABS) tablet 100 mg (100 mg Oral Given 02/20/20 2206)    ED Course  I have reviewed the triage vital signs and the nursing notes.  Pertinent labs & imaging results that were available during my care of the patient were reviewed by me and considered in my medical decision making (see chart for details).    MDM Rules/Calculators/A&P                          Medical Decision Making: ROZETTA STUMPP is a 64 y.o. female who presented to the ED today with leg swelling and skin changes to feet. Pt reports several weeks of significant fatigue.   Past medical history significant for breast cancer, thyroid cancer, chronic lymphedema, HTN (untreated, cant afford medication)  Pt reports hx of DVTx2 (not on anticoagulation, 1st occurred in setting of breast cancer, second occurred after ortho injury)  Reviewed and confirmed nursing documentation for past medical history, family history, social history.  On my initial exam, the pt was resting comfortably, NAD, BLLE warm and swollen, shiny and red with  scabbing. Multiple sores and scabs on tops of bilateral feet with golden yellow crusting.   Clinical picture of feet skin changes consistent with impetigo.   AKI with Cr increased 1.56 today (from 0.89).  NO leukocytosis, normal hGb.  Pt with significant leg swelling, pt reports chronic but redness is worse. BNP normal, CXR unremarkable, no obvious edema.  D-dimer elevated, 1.52. CTA without evidence of PE.  Duplex u/s LE without evidence of DVT.  Normal BNP, CXR without evidence of edema, doubt HF exacerbation.    Consults: none performed  All radiology and laboratory studies reviewed independently and with my attending physician, agree with reading provided by radiologist unless otherwise noted.   Upon reassessing patient, patient was resting comfortably, NAD.   Based on the above findings, I believe patient is hemodynamically stable for discharge  Patient/and family educated about specific return precautions for given chief complaint and symptoms.  Patient/and family educated about follow-up with PCP.  Patient/and family expressed understanding of return precautions and need for follow-up.  Patient discharged.  Will discharge pt with empiric abx for impetigo, given rx for doxycycline and mupirocin.   Discharge Instructions     .npapatientinstructions  ?lizabeth A Eveland:  Thank you for allowing [place] to take care of you today.  We hope you begin feeling better soon.  To-Do: 1. Please follow-up with your primary doctor or call above to schedule an appointment with a new primary care doctor 2. Hold losartan until follow up, Drink plenty of fluids, Avoid NSAIDs 3. Please use Mupirocin topically on your feet  as instructed- also it is important to clean your feet well before applying, scrub feet with warm soap and water, and dry very well  4. Please take doxycycline twice daily for 7 days 5. Please return to the Emergency Department or call 911 if you experience chest pain, shortness  of breath, severe pain, severe fever, altered mental status, or have any reason to think that you need emergency medical care.  Thank you again.  Hope you feel better soon.       .      The above care was discussed with and agreed upon by my attending physician.  Emergency Department Medication Summary:  Medications  lactated ringers bolus 1,000 mL (0 mLs Intravenous Stopped 02/20/20 2137)  iohexol (OMNIPAQUE) 350 MG/ML injection 75 mL (75 mLs Intravenous Contrast Given 02/20/20 2023)  doxycycline (VIBRA-TABS) tablet 100 mg (100 mg Oral Given 02/20/20 2206)       Final Clinical Impression(s) / ED Diagnoses Final diagnoses:  Impetigo    Rx / DC Orders ED Discharge Orders         Ordered    doxycycline (VIBRAMYCIN) 100 MG capsule  2 times daily        02/20/20 2130    mupirocin cream (BACTROBAN) 2 %  2 times daily        02/20/20 2139           Roosevelt Locks, MD 02/20/20 2213    Little, Wenda Overland, MD 02/24/20 [phone]

## 2020-02-20 NOTE — Progress Notes (Signed)
Amanda Meyers is a 64 y.o. female who presents to [facility] clinic today with complaint of right outer breast pain x three weeks that comes and goes.  Patient has severe bilateral lower extremity swelling and redness. Bilateral lower extremities warm to the touch, shiny, and scabbed. Patient has multiple sores bilateral feet and toes. Patient has bilateral 4+ pitting edema and weak peripheral pulses > left. Patient has gained around 50 pounds since July 2020. Patient stated she does not take all of her prescribed BP medications due to being unable to afford medications. Patient denied chest pains, dizziness, shortness of breath, or feeling faint. Referred patient to the [facility] ED. Patient refused to go by ambulance. Patient did agree to go to the ED by private vehicle. Patient transported to ED by Melburn Popper. Called ED and gave report to [facility] Told her that patient is on her way by private vehicle.      Pap Smear: Pap smear not completed today. Last Pap smear was 7-8years ago and normal per patient. Per patient has no history of an abnormal Pap smear. Patient has a history of a hysterectomy in 1999 due to fibroids and AUB. Patient no longer needs Pap smears due to her history of a hysterectomy for benign reasons per BCCCP and ASCCP guidelines. No Pap smear results are in EPIC.   Physical exam: Breasts Breastsnot symmetrical due to history of a left breast mastectomy in 2002 for breast cancer. Patient has bilateral breast implants.No skin abnormalities bilateral breasts. No nipple retraction right breast.No nipple dischargeright breast.No lymphadenopathy. No lumps palpated bilateral breasts. Complaints of right outer breast tenderness on exam.   Pelvic/Bimanual Pap is not indicated today per BCCCP guidelines.    Smoking History: Patient has never smoked.   Patient Navigation: Patient education provided. Access to services provided for patient through [facility] program.   Transportation provided to appointment and to ED.  Colorectal Cancer Screening: Per patient had a colonoscopy completed in 2010.Patient completed a FIT Test 03/23/2018 that was negative.FIT Test given to patient to complete. No complaints today.   Breast and Cervical Cancer Risk Assessment: Patient does not have family history of breast cancer. Patient has personal history of breast cancer. Patient has no known genetic mutations or history of radiation treatment to the chest before age 79. Patient does not have history of cervical dysplasia, immunocompromised, or DES exposure in-utero.  Risk Assessment    Risk Scores      02/20/2020 02/21/2019   Last edited by: Loletta Parish, RN Bryer Cozzolino, Heath Gold, RN   5-year risk: 2.1 % 2.1 %   Lifetime risk: 8.4 % 8.7 %          A: BCCCP exam without pap smear Complaint of right outer breast pain.  P: Referred patient to the [facility] for a diagnostic mammogram. Appointment scheduled Thursday, March 05, 2020 at 1310.  Loletta Parish, RN 02/20/2020 2:16 PM

## 2020-02-20 NOTE — Progress Notes (Signed)
Lower extremity venous has been completed.   Preliminary results in CV Proc.   Abram Sander 02/20/2020 7:47 PM

## 2020-02-20 NOTE — Progress Notes (Signed)
Patient presented with severe edema (4+), redness, chaffed skin, severe raw scablike areas on the feet and toes, odor,felt warm to touch. Pedal pulses-faint in right foot, no pulse in right pedal pulse. Right leg is larger than left.  Patient complained of pain. Patient voluntarily admitted that she has not been following her medication regimen as prescribed as she has financial difficulty, trying to keep food and shelter.  Patient was sent to ED, refused EMS services. Chrisitine Brannock, RN, contacted ED with report as patient was sent to [facility] ED ([place]) via [facility].

## 2020-02-20 NOTE — Progress Notes (Signed)
   02/20/20 2233  TOC ED Mini Assessment  TOC Time spent with patient (minutes): 25  PING Used in TOC Assessment No  Admission or Readmission Diverted Yes  Interventions which prevented an admission or readmission Medication Review  What brought you to the Emergency Department?  leg pain  Barriers to Discharge No Barriers Identified  Means of departure Other (enter comment) ([facility] transportation)  ED CM made aware of CM consult, CM spoke with ED Resident,  patient is apparently homeless from [place]. Patient is being discharged with prescriptions. Patient is uninsured and cannot afford her medications. Patient is eligible for [facility] medication assistance. Patient enrolled Letter printed co-pay waived.and given to patient with instructions on how to redeem,  prescriptions were printed and given to patient who states she will use [facility] in [place]. ED CSW will arrange transportation back to [place]. No further ED CM needs identified.

## 2020-02-21 ENCOUNTER — Other Ambulatory Visit: Payer: Self-pay

## 2020-02-21 NOTE — Telephone Encounter (Signed)
levothyroxine (SYNTHROID) 100 MCG tablet, REFILL REQUEST @ [place]. Please call pt back.

## 2020-02-21 NOTE — Telephone Encounter (Signed)
Levothyroxine was refilled 10/2019 #30 x 11 refills. Asked pt to call the pharmacy and if she has any problems to call us back. Stated she will.

## 2020-02-25 ENCOUNTER — Telehealth: Payer: Self-pay

## 2020-02-25 NOTE — Telephone Encounter (Signed)
Patient called to confirm what time her appointment is for the [facility]. Informed patient that [facility] 2020 @ 1:10 pm. Informed patient that transportation has also been arranged to and from her appointment. Gave patient the number for transportation services for patient to call when she is ready to be picked up from the [facility] to go home.

## 2020-03-02 ENCOUNTER — Ambulatory Visit (INDEPENDENT_AMBULATORY_CARE_PROVIDER_SITE_OTHER): Payer: No Typology Code available for payment source | Admitting: Student

## 2020-03-02 ENCOUNTER — Telehealth: Payer: Self-pay

## 2020-03-02 ENCOUNTER — Telehealth: Payer: Self-pay | Admitting: Student

## 2020-03-02 DIAGNOSIS — R21 Rash and other nonspecific skin eruption: Secondary | ICD-10-CM | POA: Insufficient documentation

## 2020-03-02 DIAGNOSIS — I10 Essential (primary) hypertension: Secondary | ICD-10-CM

## 2020-03-02 DIAGNOSIS — I890 Lymphedema, not elsewhere classified: Secondary | ICD-10-CM

## 2020-03-02 NOTE — Telephone Encounter (Signed)
Spoke with patient via telephone call. Verified identify using DOB and address.  Patient had a recent visit to the ED, found to have impetigo in lower extremities. Prescribed oral doxycycline for 7 days and topical mupirocin for 10 days. She has finished her oral and topical antibiotic cream. However, still complaining of swelling, redness, and warmth in bilateral lower extremities (left leg is worse than right leg). She reports that her rash has improved a little but it is about the same in area as before starting antibiotics. She does endorse yellowish drainage from rash, stating that it gets on her bed sheets forcing her to wash them frequently. No fevers or chills, headaches, shortness of breath, pruritis.  She reports that she was told she has peripheral neuropathy. Unsure if she has history of peripheral vascular disease. From chart review, she does have a history of chronic lymphedema bilaterally.  Discussed with patient to come in for an office visit so that we can reevaluate her rash. Not completely sure if her symptoms are from impetigo (as it is unusual to see impetigo bilaterally) or if symptoms are chronic from her chronic lymphedema (which can also causing weeping).   F/u in clinic on Wednesday (03/02/2020).

## 2020-03-02 NOTE — Assessment & Plan Note (Signed)
Patient had a recent visit to the ED, found to have impetigo in lower extremities. Prescribed oral doxycycline for 7 days and topical mupirocin for 10 days. She has finished her oral and topical antibiotic cream. However, still complaining of swelling, redness, and warmth in bilateral lower extremities (left leg is worse than right leg). She reports that her rash has improved a little but it is about the same in area as before starting antibiotics. She does endorse yellowish drainage from rash, stating that it gets on her bed sheets forcing her to wash them frequently. No fevers or chills, headaches, shortness of breath, pruritis.  She reports that she was told she has peripheral neuropathy. Unsure if she has history of peripheral vascular disease. From chart review, she does have a history of chronic lymphedema bilaterally.  Discussed with patient to come in for an office visit so that we can reevaluate her rash. Not completely sure if her symptoms are from impetigo (as it is unusual to see impetigo bilaterally) or if symptoms are chronic from her chronic lymphedema (which can also causing weeping).   -F/u in clinic on Wednesday (03/02/2020)

## 2020-03-02 NOTE — Telephone Encounter (Signed)
Discussed with Dr. Allyson Sabal, agree with plan to evaluate in person soonest available on Wednesday. Low suspicion for infectious process with bilateral involvement and lack of improvement with oral and topical antibiotics.

## 2020-03-02 NOTE — Telephone Encounter (Signed)
Patient called stating that she is out of her antibiotic cream that was given to her in the hospital for impetigo. Patient stated that she needed a refill for more cream. Informed patient that she will need to call Internal Medicine to see if they can get her a refill. Patient voiced understanding.

## 2020-03-05 ENCOUNTER — Other Ambulatory Visit: Payer: Self-pay

## 2020-03-05 ENCOUNTER — Ambulatory Visit (INDEPENDENT_AMBULATORY_CARE_PROVIDER_SITE_OTHER): Payer: No Typology Code available for payment source | Admitting: Student

## 2020-03-05 ENCOUNTER — Ambulatory Visit: Payer: No Typology Code available for payment source

## 2020-03-05 ENCOUNTER — Other Ambulatory Visit: Payer: Self-pay | Admitting: Student

## 2020-03-05 ENCOUNTER — Ambulatory Visit
Admit: 2020-03-05 | Discharge: 2020-03-05 | Disposition: A | Discharge status: Home / Self Care | Payer: No Typology Code available for payment source | Attending: Obstetrics and Gynecology | Admitting: Obstetrics and Gynecology

## 2020-03-05 ENCOUNTER — Encounter: Payer: Self-pay | Admitting: Student

## 2020-03-05 DIAGNOSIS — I890 Lymphedema, not elsewhere classified: Secondary | ICD-10-CM

## 2020-03-05 DIAGNOSIS — I10 Essential (primary) hypertension: Secondary | ICD-10-CM

## 2020-03-05 DIAGNOSIS — K219 Gastro-esophageal reflux disease without esophagitis: Secondary | ICD-10-CM

## 2020-03-05 DIAGNOSIS — N644 Mastodynia: Secondary | ICD-10-CM

## 2020-03-05 DIAGNOSIS — R21 Rash and other nonspecific skin eruption: Secondary | ICD-10-CM

## 2020-03-05 MED ORDER — DOXYCYCLINE HYCLATE 100 MG PO CAPS: 100.0000 mg | ORAL_CAPSULE | Freq: Two times a day (BID) | ORAL | 0 refills | Status: DC

## 2020-03-05 MED ORDER — METOPROLOL SUCCINATE ER 100 MG PO TB24
100.0000 mg | ORAL_TABLET | Freq: Every day | ORAL | 11 refills | Status: DC
Start: 2020-03-05 — End: 2020-03-05

## 2020-03-05 MED ORDER — AMLODIPINE BESYLATE 5 MG PO TABS: 5.0000 mg | ORAL_TABLET | Freq: Every day | ORAL | 11 refills | Status: DC

## 2020-03-05 MED ORDER — ROSUVASTATIN CALCIUM 5 MG PO TABS
5.0000 mg | ORAL_TABLET | Freq: Every day | ORAL | 2 refills | Status: DC
Start: 2020-03-05 — End: 2020-03-05

## 2020-03-05 MED ORDER — LEVOTHYROXINE SODIUM 100 MCG PO TABS: 100.0000 ug | ORAL_TABLET | Freq: Every day | ORAL | 11 refills | Status: DC

## 2020-03-05 MED ORDER — LOSARTAN POTASSIUM 100 MG PO TABS: 100.0000 mg | ORAL_TABLET | Freq: Every day | ORAL | 11 refills | Status: DC

## 2020-03-05 MED ORDER — ASPIRIN 81 MG PO TBEC: 81.0000 mg | DELAYED_RELEASE_TABLET | Freq: Every day | ORAL | 3 refills | Status: AC

## 2020-03-05 MED ORDER — OMEPRAZOLE 20 MG PO CPDR: 20.0000 mg | DELAYED_RELEASE_CAPSULE | Freq: Every day | ORAL | 3 refills | Status: DC

## 2020-03-05 MED FILL — METOPROLOL SUCCINATE ER 100: 100 | 30 days supply | Qty: 30 | Fill #0

## 2020-03-05 MED FILL — AMLODIPINE BESYLATE 5 MG TA: 5 | 30 days supply | Qty: 30 | Fill #0

## 2020-03-05 MED FILL — LOSARTAN POTASSIUM 100 MG T: 100 | 30 days supply | Qty: 30 | Fill #0

## 2020-03-05 MED FILL — OMEPRAZOLE DR 20 MG CAPSULE: 20 | 30 days supply | Qty: 30 | Fill #0

## 2020-03-05 MED FILL — DOXYCYCLINE HYCLATE 100 MG: 100 | 7 days supply | Qty: 14 | Fill #0

## 2020-03-05 MED FILL — ROSUVASTATIN CALCIUM 5 MG T: 5 | 30 days supply | Qty: 30 | Fill #0

## 2020-03-05 MED FILL — LEVOTHYROXINE 100 MCG TABLE: 100 | 30 days supply | Qty: 30 | Fill #0

## 2020-03-05 NOTE — Progress Notes (Signed)
   CC: f/u for rash in b/l legs  HPI:  Amanda Meyers is a 64 y.o. female with history listed below presenting to the [facility] for rash in bilateral lower extremities. Please see individualized A&P for full HPI.  Past Medical History:  Diagnosis Date  . Cancer (HCC)    Breast  . Dyslipidemia   . History of DVT (deep vein thrombosis)   . History of papillary adenocarcinoma of thyroid    Stage 1  . Hypertension   . Hypothyroidism   . Lung disease   . S/P thyroidectomy 11/2005   Suspicious nodules, path- 2 foci left lobe (10 &81mm) I-131 Rx 02/2006, neg thyrogen scan 10/2006, thyroglobulin  0.7 neg antibody 11/2006 07/2007 01/2008 07/2008 07/2009  . Surgical hypoparathyroidism ([place])   . Urinary incontinence   . Urolithiasis    Review of Systems:   Negative ROS aside from that listed in the individualized A&P.  Physical Exam:  Vitals:   03/05/20 1459  BP: (!) 164/86  Pulse: 77  Temp: 97.8 F (36.6 C)  SpO2: 100%  Weight: 251 lb 6.4 oz (114 kg)  Height: 5\' 8"  (1.727 m)   Physical Exam Constitutional:      Appearance: She is obese. She is not ill-appearing.  HENT:     Head: Normocephalic and atraumatic.     Mouth/Throat:     Mouth: Mucous membranes are moist.     Pharynx: Oropharynx is clear. No oropharyngeal exudate.  Eyes:     Extraocular Movements: Extraocular movements intact.     Conjunctiva/sclera: Conjunctivae normal.     Pupils: Pupils are equal, round, and reactive to light.  Cardiovascular:     Rate and Rhythm: Normal rate and regular rhythm.     Pulses: Normal pulses.     Heart sounds: Normal heart sounds. No murmur heard.  No friction rub. No gallop.   Pulmonary:     Effort: Pulmonary effort is normal. No respiratory distress.     Breath sounds: Normal breath sounds. No wheezing, rhonchi or rales.  Abdominal:     General: Bowel sounds are normal. There is no distension.     Palpations: Abdomen is soft.     Tenderness: There is no abdominal  tenderness. There is no guarding or rebound.  Musculoskeletal:        General: Swelling present. No tenderness.     Cervical back: Normal range of motion.     Comments: Significant edema in bilateral lower extremities (L>R) up to knees from chronic lymphedema  Skin:    General: Skin is warm and dry.     Capillary Refill: Capillary refill takes less than 2 seconds.     Findings: Rash present.     Comments: Skin is warm and dry. There is a rash in bilateral lower extremities although more prominent on left leg. Affected area is more red in appearance. Not warmer than unaffected skin.   Neurological:     General: No focal deficit present.     Mental Status: She is alert and oriented to person, place, and time.  Psychiatric:        Mood and Affect: Mood normal.        Behavior: Behavior normal.      Assessment & Plan:   See Encounters Tab for problem based charting.  Patient seen with Dr. Philipp Ovens

## 2020-03-05 NOTE — Assessment & Plan Note (Addendum)
Patient with SSTI in bilateral lower extremities. She received 7 days of oral doxycycline and 10 days of topical mupirocin, both of which she finished. She is still complaining of swelling, redness, and warmth in both legs. Denies pain, fevers, chills, headaches, SHOB, pruritis. On exam, left leg is more swollen than right leg and rash is more prominent on left leg as well. The skin affected with rash is the same temperature as the rest of her legs. She reports improvement with doxycycline.  Patient does have a history of chronic lymphedema bilaterally from prior radiation treatment and she also has a history of PVD.   I am unsure at this time whether the rash is from chronic lymphedema sequelae or from an infectious etiology. She does report getting frequent mosquito bites in both ankles which could have been a potential nidus for skin infection. I will prescribe 7 more days of oral doxycycline and have patient follow up in 1 week to evaluate for improvement. If rash has subsided, can place UNNA boots to help with patient's chronic lymphedema.  Plan: -repeat 7 day course of oral doxycyline 100mg  BID -f/u visit in 1 week for reevaluation of rash and for UNNA boot placement -placed referral to lymphedema clinic

## 2020-03-05 NOTE — Patient Instructions (Signed)
Amanda Meyers,  It was a pleasure seeing you in the clinic today.   We discussed the following today:  1. I will prescribe a 7-day course of doxycyline (take one pill 2 times each day).   2. I have sent refills for your other medications to the pharmacy.  3. Please come back to the clinic in 1 week so that we can evaluate your rash and for [facility] boot placement  Please call our clinic at [phone] if you have any questions or concerns. The best time to call is Monday-Friday from 9am-4pm, but there is someone available 24/7 at the same number. If you need medication refills, please notify your pharmacy one week in advance and they will send [place] a request.   Thank you for letting us take part in your care. We look forward to seeing you next time!

## 2020-03-05 NOTE — Assessment & Plan Note (Signed)
Patient with history of chronic lymphedema from radiation treatment as per patient. She does not use compressive stockings at home due to discomfort. Left leg is more swollen than right leg.  -Placed referral to lymphedema clinic for further management.

## 2020-03-05 NOTE — Assessment & Plan Note (Addendum)
Patient with history of HTN, on norvasc 5mg , cozaar 100mg , and metoprolol 100mg . BP 164/86 today. Patient's primary complaint is rash in legs so will revisit this at next visit in 1 week.   -refilled current BP meds -monitor BP at next visit -if still elevated, consider adjusting BP medications.

## 2020-03-06 ENCOUNTER — Encounter: Payer: No Typology Code available for payment source | Admitting: Student

## 2020-03-06 NOTE — Progress Notes (Signed)
Internal Medicine Clinic Attending  I saw and evaluated the patient.  I personally confirmed the key portions of the history and exam documented by Dr. Jinwala and I reviewed pertinent patient test results.  The assessment, diagnosis, and plan were formulated together and I agree with the documentation in the resident's note.  

## 2020-03-13 ENCOUNTER — Encounter: Payer: Self-pay | Admitting: Internal Medicine

## 2020-04-07 MED FILL — LOSARTAN POTASSIUM 100 MG T: 100 | 30 days supply | Qty: 30 | Fill #1

## 2020-04-07 MED FILL — ROSUVASTATIN CALCIUM 5 MG T: 5 | 30 days supply | Qty: 30 | Fill #1

## 2020-04-07 MED FILL — LEVOTHYROXINE 100 MCG TABLE: 100 | 30 days supply | Qty: 30 | Fill #1

## 2020-04-07 MED FILL — METOPROLOL SUCCINATE ER 100: 100 | 30 days supply | Qty: 30 | Fill #1

## 2020-04-07 MED FILL — OMEPRAZOLE DR 20 MG CAPSULE: 20 | 30 days supply | Qty: 30 | Fill #1

## 2020-04-07 MED FILL — AMLODIPINE BESYLATE 5 MG TA: 5 | 30 days supply | Qty: 30 | Fill #1

## 2020-04-13 NOTE — Addendum Note (Signed)
Addended by: Hulan Fray on: 04/13/2020 05:52 PM   Modules accepted: Orders

## 2020-08-06 ENCOUNTER — Other Ambulatory Visit (HOSPITAL_COMMUNITY): Payer: Self-pay

## 2020-08-06 MED FILL — Levothyroxine Sodium Tab 100 MCG: ORAL | 30 days supply | Qty: 30 | Fill #0 | Status: AC

## 2020-08-07 ENCOUNTER — Other Ambulatory Visit (HOSPITAL_COMMUNITY): Payer: Self-pay

## 2020-09-04 ENCOUNTER — Other Ambulatory Visit (HOSPITAL_COMMUNITY): Payer: Self-pay

## 2020-09-04 MED FILL — Levothyroxine Sodium Tab 100 MCG: ORAL | 30 days supply | Qty: 30 | Fill #1 | Status: AC

## 2020-10-06 ENCOUNTER — Other Ambulatory Visit (HOSPITAL_COMMUNITY): Payer: Self-pay

## 2020-10-06 MED FILL — Levothyroxine Sodium Tab 100 MCG: ORAL | 30 days supply | Qty: 30 | Fill #2 | Status: AC

## 2020-11-03 ENCOUNTER — Other Ambulatory Visit (HOSPITAL_COMMUNITY): Payer: Self-pay

## 2020-11-03 MED FILL — Levothyroxine Sodium Tab 100 MCG: ORAL | 30 days supply | Qty: 30 | Fill #3 | Status: AC

## 2020-12-03 ENCOUNTER — Other Ambulatory Visit (HOSPITAL_COMMUNITY): Payer: Self-pay

## 2020-12-03 MED FILL — Levothyroxine Sodium Tab 100 MCG: ORAL | 30 days supply | Qty: 30 | Fill #4 | Status: AC

## 2021-01-04 ENCOUNTER — Other Ambulatory Visit (HOSPITAL_COMMUNITY): Payer: Self-pay

## 2021-01-04 MED FILL — Levothyroxine Sodium Tab 100 MCG: ORAL | 30 days supply | Qty: 30 | Fill #5 | Status: AC

## 2021-02-01 ENCOUNTER — Other Ambulatory Visit (HOSPITAL_COMMUNITY): Payer: Self-pay

## 2021-02-01 MED FILL — Levothyroxine Sodium Tab 100 MCG: ORAL | 30 days supply | Qty: 30 | Fill #6 | Status: AC

## 2021-02-02 ENCOUNTER — Other Ambulatory Visit (HOSPITAL_COMMUNITY): Payer: Self-pay

## 2021-02-04 ENCOUNTER — Other Ambulatory Visit: Payer: Self-pay

## 2021-02-05 ENCOUNTER — Other Ambulatory Visit (HOSPITAL_COMMUNITY): Payer: Self-pay

## 2021-02-05 MED ORDER — METOPROLOL SUCCINATE ER 100 MG PO TB24
100.0000 mg | ORAL_TABLET | Freq: Every day | ORAL | 11 refills | Status: AC
  Filled 2021-02-05: qty 30, 30d supply, fill #0

## 2021-02-05 MED ORDER — LEVOTHYROXINE SODIUM 100 MCG PO TABS
100.0000 ug | ORAL_TABLET | Freq: Every day | ORAL | 11 refills | Status: AC
  Filled 2021-02-05: qty 30, fill #0
  Filled 2021-02-25: qty 30, 30d supply, fill #0

## 2021-02-09 ENCOUNTER — Other Ambulatory Visit (HOSPITAL_COMMUNITY): Payer: Self-pay

## 2021-02-25 ENCOUNTER — Other Ambulatory Visit (HOSPITAL_COMMUNITY): Payer: Self-pay

## 2021-02-25 ENCOUNTER — Telehealth: Payer: Self-pay | Admitting: *Deleted

## 2021-02-25 NOTE — Telephone Encounter (Signed)
Patient called in stating [facility] no longer has transportation to [facility] OP. She is requesting Rx for levothyoxine to be sent to [facility] in [place]. She will call [facility] and have them transfer Rx sent 10/11 for 30 with 11 refills. She was made aware that last OV was 03/05/2020. States she will call back to schedule appt.

## 2021-05-20 ENCOUNTER — Encounter (HOSPITAL_BASED_OUTPATIENT_CLINIC_OR_DEPARTMENT_OTHER): Payer: Self-pay | Admitting: Internal Medicine

## 2021-06-19 IMAGING — MG MM DIGITAL DIAGNOSTIC UNILAT*R* IMPLANT W/ TOMO W/ CAD
8 of 13 series · 8 of 33 positions shown · non-contrast
Comparison: Previous exam(s).

CLINICAL DATA: Patient presents for diffuse right breast
tenderness. History of left breast cancer status post mastectomy.

EXAM:
DIGITAL DIAGNOSTIC RIGHT MAMMOGRAM WITH IMPLANTS, CAD AND TOMO
The patient has retropectoral implants. Standard and implant
displaced views were performed.

[R MLO]
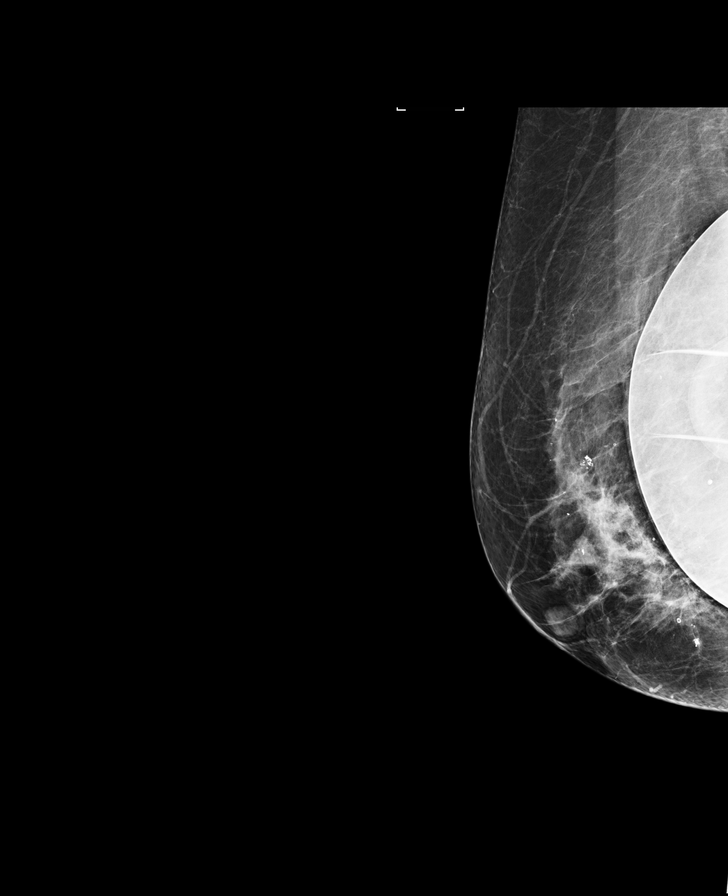

[R CC (1 of 2)]
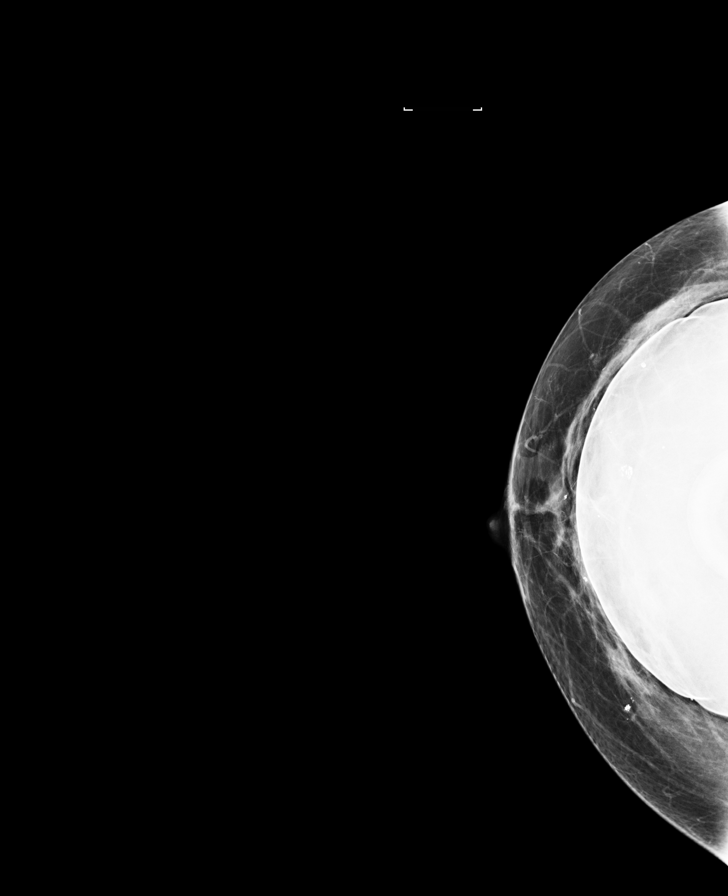

[R CC (2 of 2)]
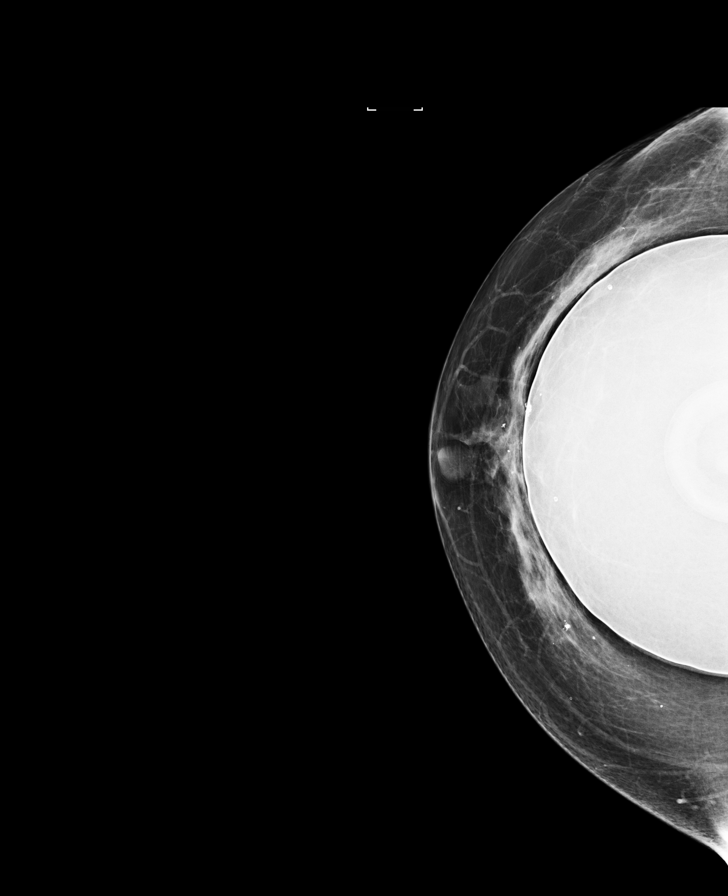

[R CC synth-2D (1 of 2)]
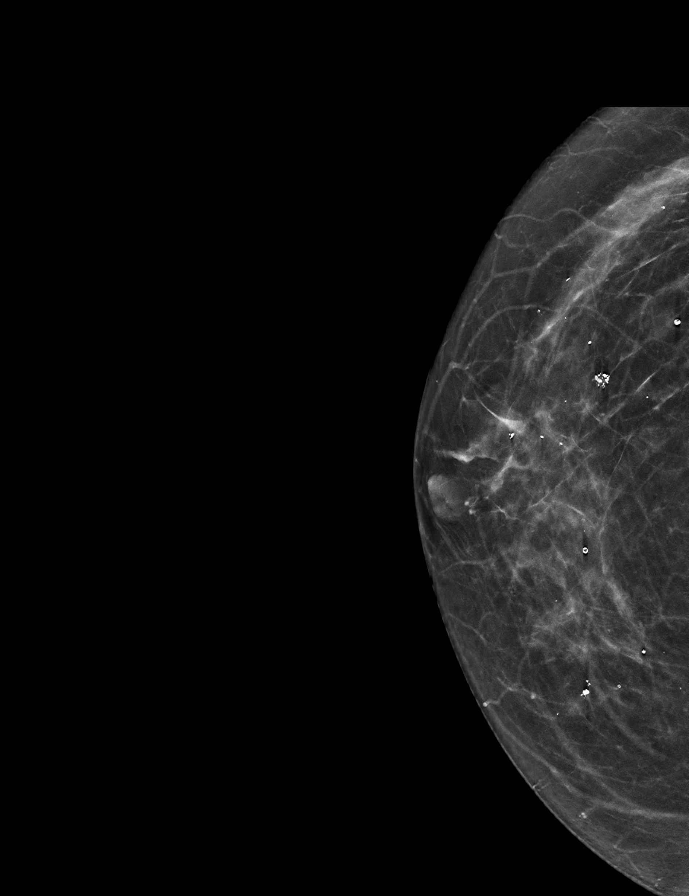

[R MLO synth-2D (1 of 2)]
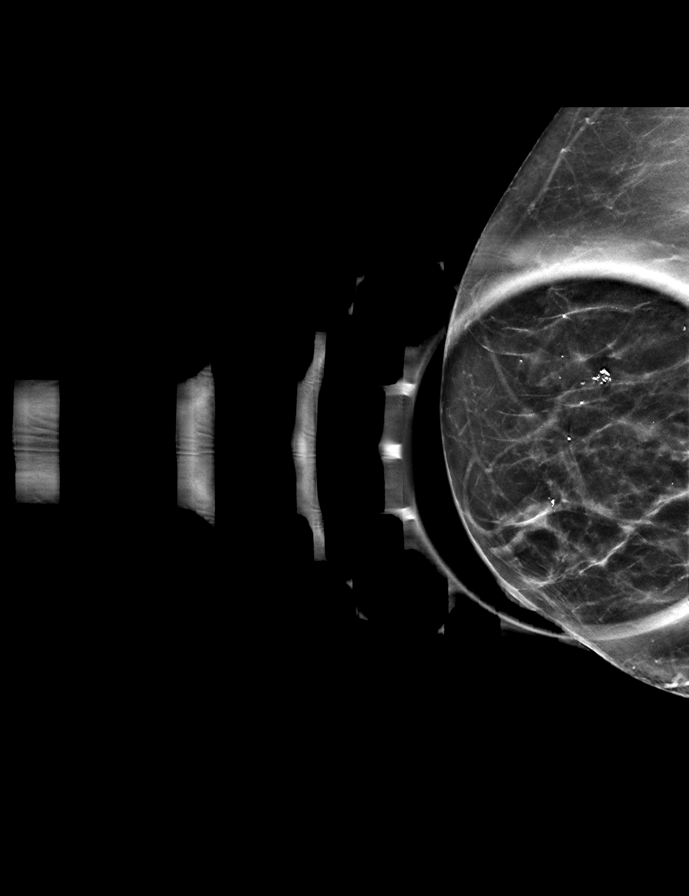

[R ML synth-2D]
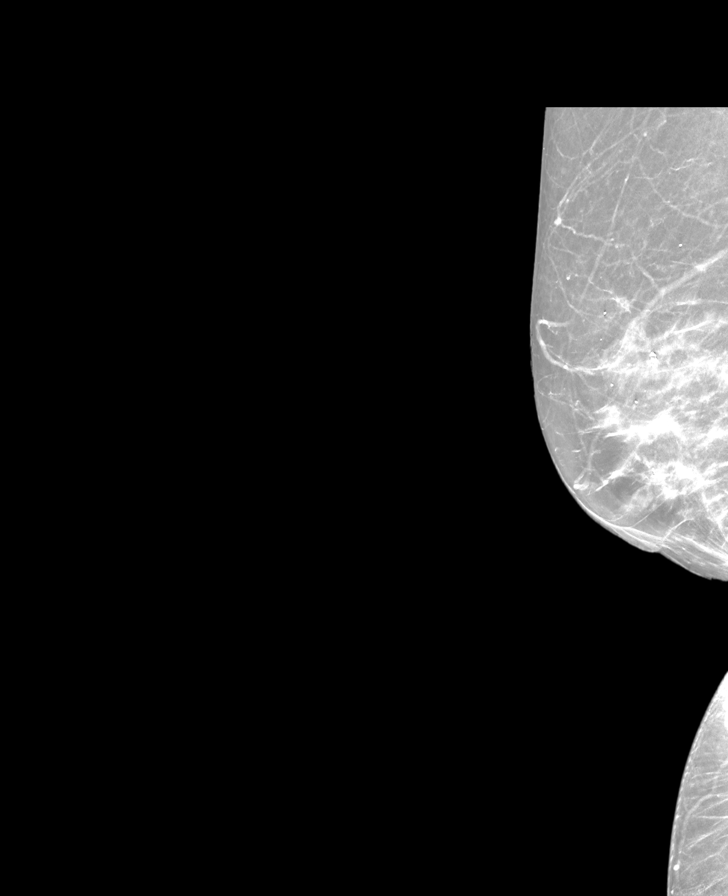

[R CC synth-2D (2 of 2)]
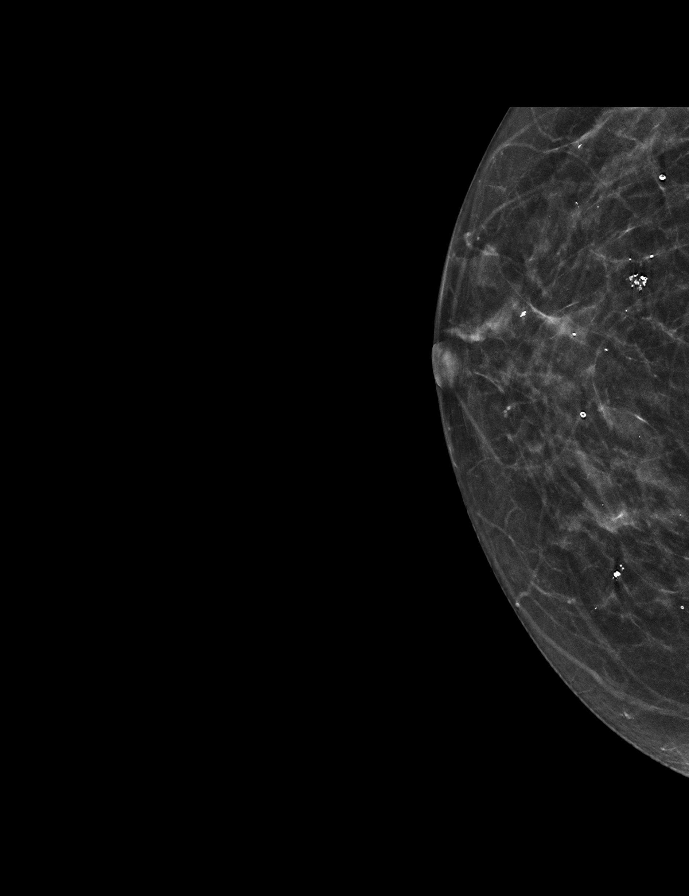

[R MLO synth-2D (2 of 2)]
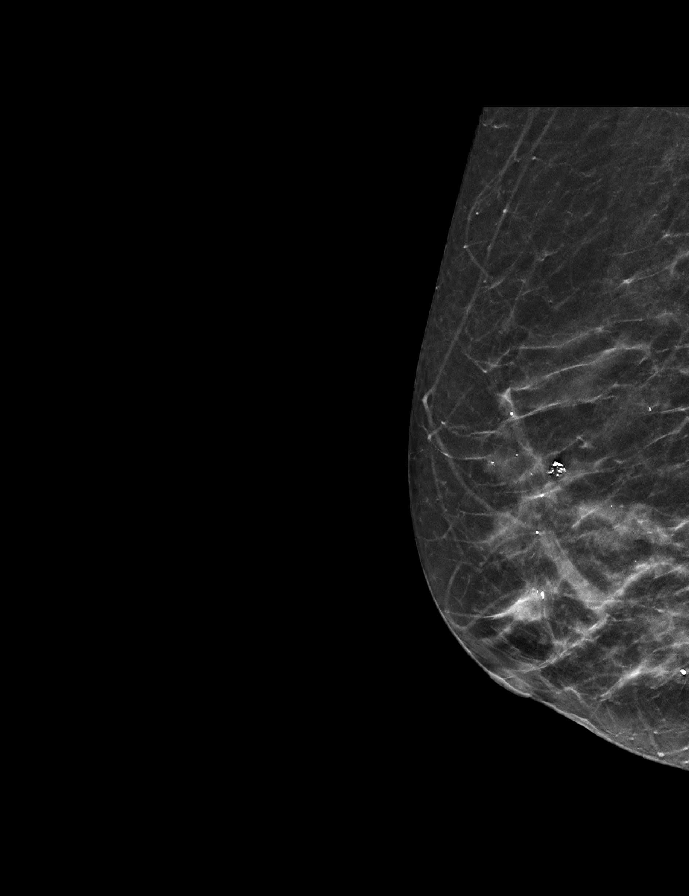

[8 of 33 positions shown; findings below may reference images not displayed]

ACR Breast Density Category c: The breast tissue is heterogeneously
dense, which may obscure small masses.
FINDINGS: No concerning masses, calcifications or nonsurgical distortion
identified within the right breast.

Mammographic images were processed with CAD.
IMPRESSION: No mammographic evidence for malignancy.

RECOMMENDATION:
Continued clinical evaluation for diffuse right breast pain.

Screening mammogram in one year.(Code:[id number]-G-[id number])

I have discussed the findings and recommendations with the patient.
If applicable, a reminder letter will be sent to the patient
regarding the next appointment.

BI-RADS CATEGORY  1: Negative.

## 2021-08-11 ENCOUNTER — Encounter (HOSPITAL_COMMUNITY): Payer: Self-pay

## 2021-08-11 ENCOUNTER — Ambulatory Visit (HOSPITAL_COMMUNITY): Payer: Self-pay | Admitting: Physical Therapy

## 2021-08-13 ENCOUNTER — Encounter (HOSPITAL_COMMUNITY): Payer: Self-pay | Admitting: Physical Therapy

## 2021-08-17 ENCOUNTER — Encounter (HOSPITAL_COMMUNITY): Payer: Self-pay | Admitting: Physical Therapy

## 2021-08-18 ENCOUNTER — Encounter (HOSPITAL_COMMUNITY): Payer: Self-pay | Admitting: Physical Therapy

## 2021-08-23 ENCOUNTER — Encounter (HOSPITAL_COMMUNITY): Payer: Self-pay | Admitting: Physical Therapy

## 2021-08-25 ENCOUNTER — Encounter (HOSPITAL_COMMUNITY): Payer: Self-pay

## 2021-08-27 ENCOUNTER — Encounter (HOSPITAL_COMMUNITY): Payer: Self-pay | Admitting: Physical Therapy

## 2021-08-30 ENCOUNTER — Encounter (HOSPITAL_COMMUNITY): Payer: Self-pay | Admitting: Physical Therapy

## 2021-09-01 ENCOUNTER — Encounter (HOSPITAL_COMMUNITY): Payer: Self-pay

## 2021-09-03 ENCOUNTER — Encounter (HOSPITAL_COMMUNITY): Payer: Self-pay | Admitting: Physical Therapy

## 2021-09-06 ENCOUNTER — Encounter (HOSPITAL_COMMUNITY): Payer: Self-pay | Admitting: Physical Therapy

## 2021-09-08 ENCOUNTER — Encounter (HOSPITAL_COMMUNITY): Payer: Self-pay | Admitting: Physical Therapy

## 2021-09-13 ENCOUNTER — Encounter (HOSPITAL_COMMUNITY): Payer: Self-pay | Admitting: Physical Therapy

## 2021-09-15 ENCOUNTER — Encounter (HOSPITAL_COMMUNITY): Payer: Self-pay | Admitting: Physical Therapy

## 2021-09-17 ENCOUNTER — Encounter (HOSPITAL_COMMUNITY): Payer: Self-pay

## 2021-09-22 ENCOUNTER — Encounter (HOSPITAL_COMMUNITY): Payer: Self-pay | Admitting: Physical Therapy

## 2021-09-23 ENCOUNTER — Encounter (HOSPITAL_COMMUNITY): Payer: Self-pay | Admitting: Physical Therapy

## 2021-10-16 ENCOUNTER — Encounter: Payer: Self-pay | Admitting: *Deleted
